# Patient Record
Sex: Female | Born: 1950 | Race: White | Hispanic: No | Marital: Married | Smoking: Current every day smoker
Health system: Southern US, Academic
[De-identification: ages and names within clinical notes are randomized; demographics above are authoritative.]

## PROBLEM LIST (undated history)

## (undated) DIAGNOSIS — J449 Chronic obstructive pulmonary disease, unspecified: Secondary | ICD-10-CM

## (undated) DIAGNOSIS — M1990 Unspecified osteoarthritis, unspecified site: Secondary | ICD-10-CM

## (undated) DIAGNOSIS — C801 Malignant (primary) neoplasm, unspecified: Secondary | ICD-10-CM

## (undated) DIAGNOSIS — C3490 Malignant neoplasm of unspecified part of unspecified bronchus or lung: Secondary | ICD-10-CM

## (undated) HISTORY — PX: HX COLONOSCOPY: 2100001147

## (undated) HISTORY — DX: Unspecified osteoarthritis, unspecified site: M19.90

## (undated) HISTORY — PX: HX APPENDECTOMY: SHX54

## (undated) HISTORY — DX: Malignant neoplasm of unspecified part of unspecified bronchus or lung (CMS HCC): C34.90

## (undated) HISTORY — PX: BRONCHOSCOPY: SUR163

## (undated) HISTORY — PX: HX TUBAL LIGATION: SHX77

## (undated) HISTORY — PX: NECK SURGERY: SHX720

## (undated) HISTORY — PX: HX GALL BLADDER SURGERY/CHOLE: SHX55

## (undated) HISTORY — DX: Malignant (primary) neoplasm, unspecified (CMS HCC): C80.1

## (undated) HISTORY — PX: PORTACATH PLACEMENT: SHX2246

---

## 2001-09-09 ENCOUNTER — Other Ambulatory Visit (HOSPITAL_COMMUNITY): Payer: Self-pay | Admitting: Family Medicine

## 2020-12-25 IMAGING — MR MRI SHOULDER RT W/O CONTRAST
8 of 9 series · 33 of 40 positions shown · IV contrast (gadolinium)
Comparison: Radiographs from outside facility dated 12/12/2020.

﻿EXAM:  [id number]   MRI SHOULDER RT W/O CONTRAST
INDICATION: Pain.
TECHNIQUE: Multiplanar multisequential MRI of the right shoulder joint was performed without gadolinium contrast.

[Series 4: axial shim · axial · right · 10.0mm · 3.12mm/px · z∈[-80,+40]mm · 2 of 13 slices shown (1 of 2)]
[im 1/13]
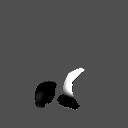
[im 13/13]
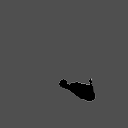

[Series 5: axial shim · axial · right · 10.0mm · 3.12mm/px · z∈[-80,+40]mm · 3 of 13 slices shown (2 of 2)]
[im 1/13]
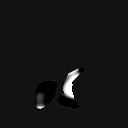
[im 7/13]
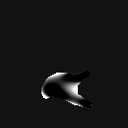
[im 13/13]
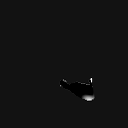

[Series 6: T1 · oblique · right · 4.0mm · 0.31mm/px · 5 of 22 slices shown]
[im 1/22]
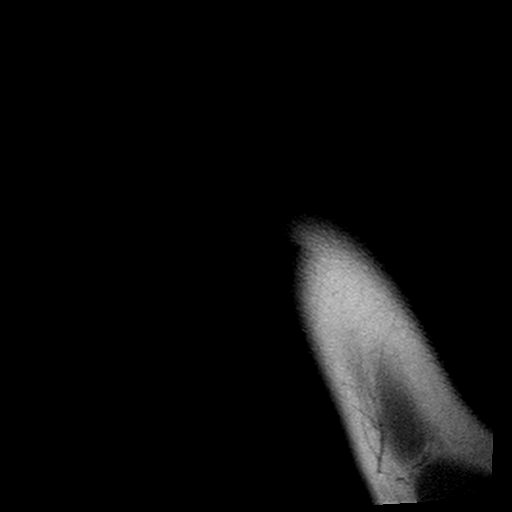
[im 6/22]
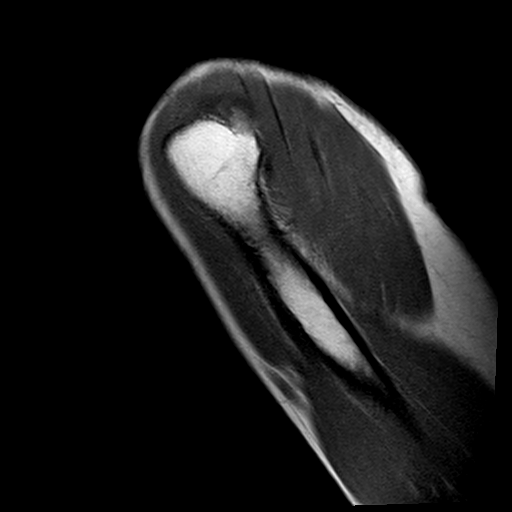
[im 11/22]
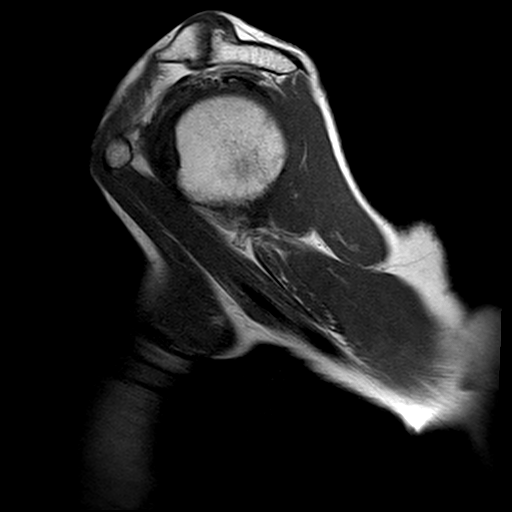
[im 16/22]
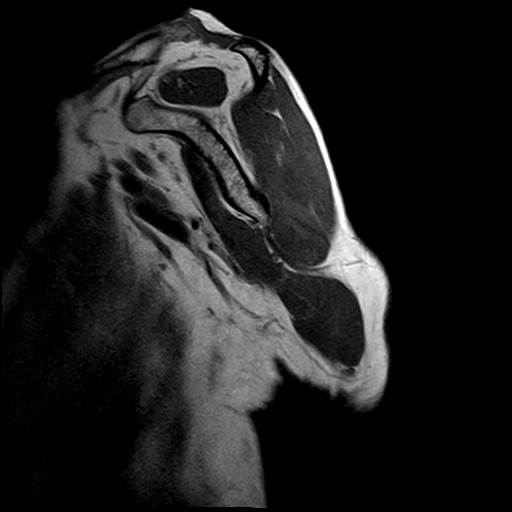
[im 22/22]
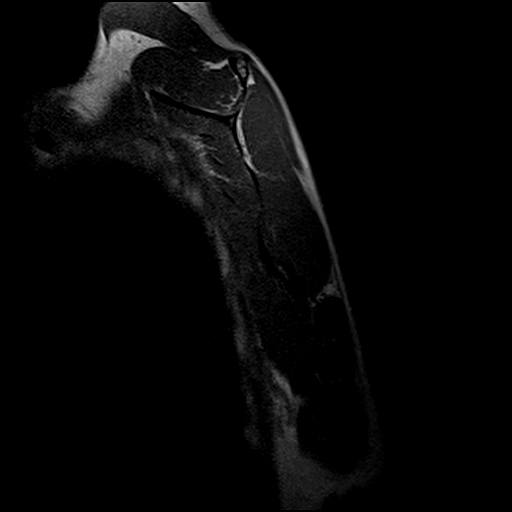

[Series 7: T2 · oblique · right · 4.0mm · 0.42mm/px · 5 of 22 slices shown]
[im 1/22]
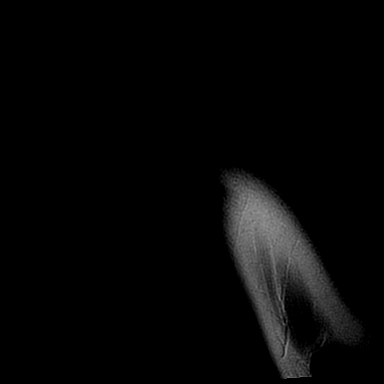
[im 6/22]
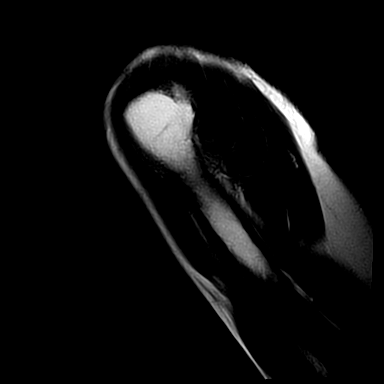
[im 11/22]
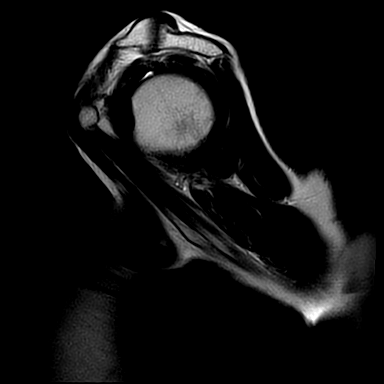
[im 16/22]
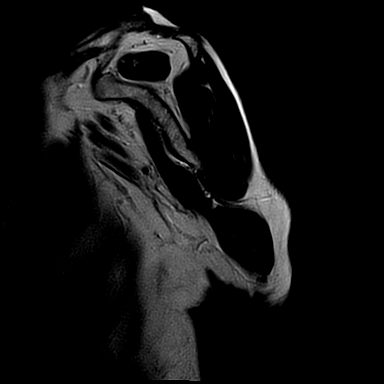
[im 22/22]
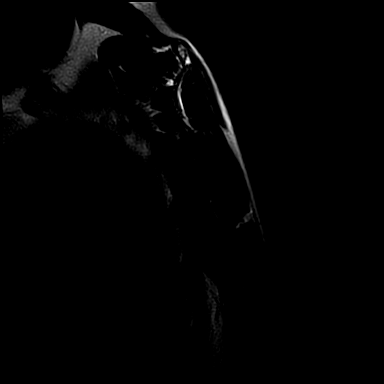

[Series 8: STIR · oblique · right · 4.0mm · 0.36mm/px · 3 of 22 slices shown]
[im 1/22]
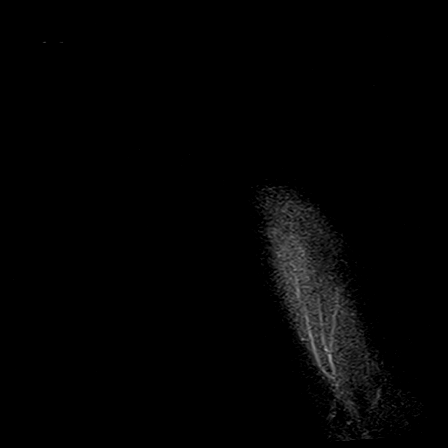
[im 6/22]
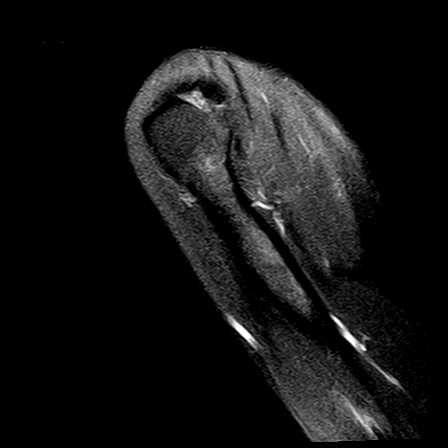
[im 11/22]
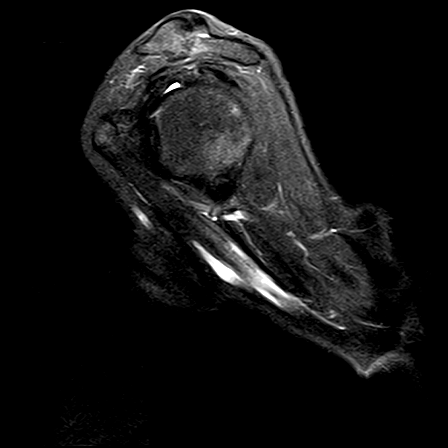

[Series 9: PD fat-sat · axial · right · 4.0mm · 0.36mm/px · z∈[-64,+46]mm · 5 of 24 slices shown (1 of 2)]
[im 1/24]
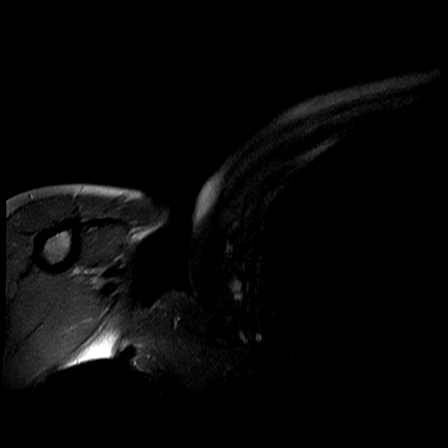
[im 6/24]
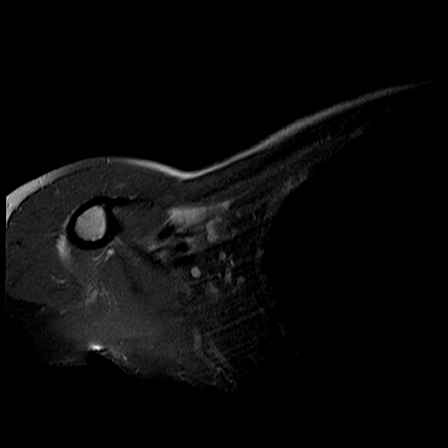
[im 12/24]
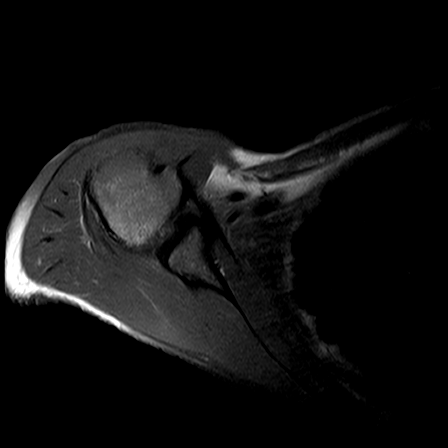
[im 18/24]
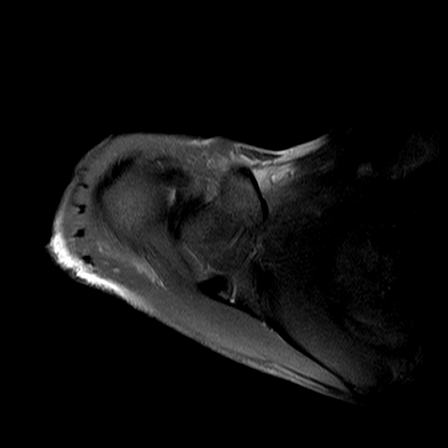
[im 24/24]
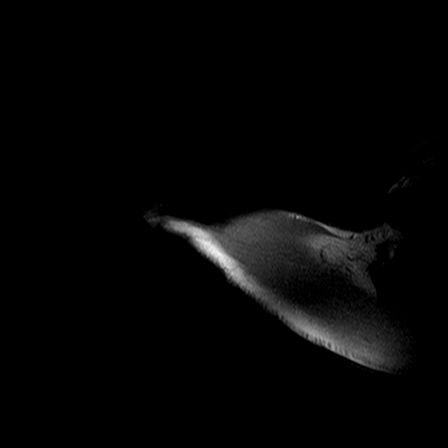

[Series 10: PD fat-sat · oblique · right · 4.0mm · 0.47mm/px · 5 of 23 slices shown (2 of 2)]
[im 1/23]
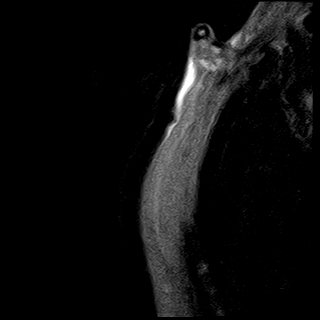
[im 6/23]
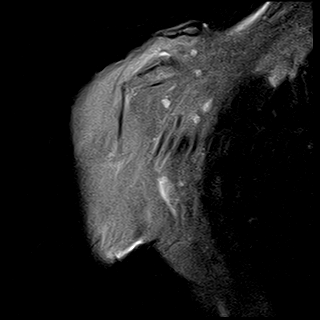
[im 12/23]
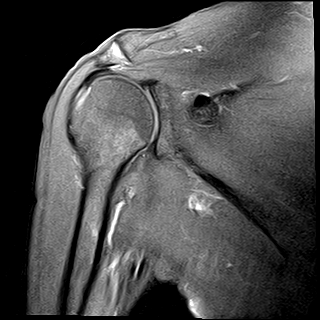
[im 17/23]
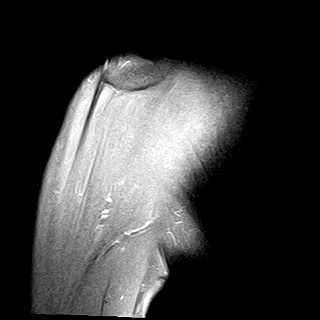
[im 23/23]
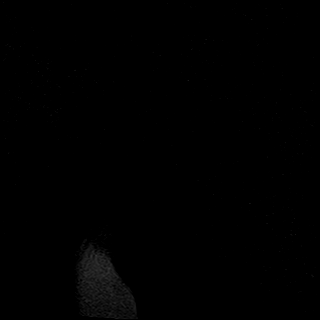

[Series 12: T2 fat-sat · axial · right · 4.0mm · 0.42mm/px · z∈[-64,+46]mm · 5 of 24 slices shown]
[im 1/24]
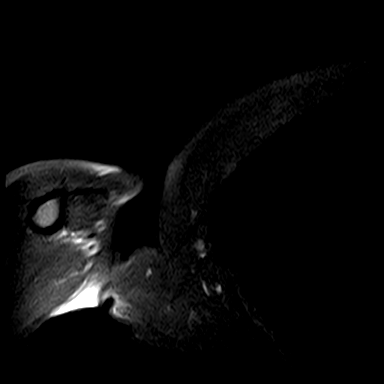
[im 6/24]
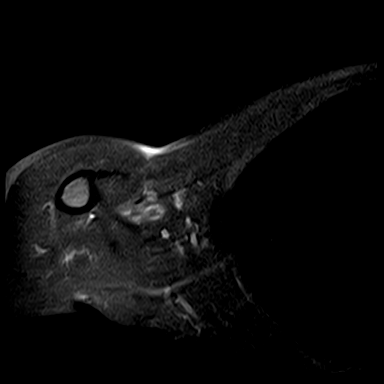
[im 12/24]
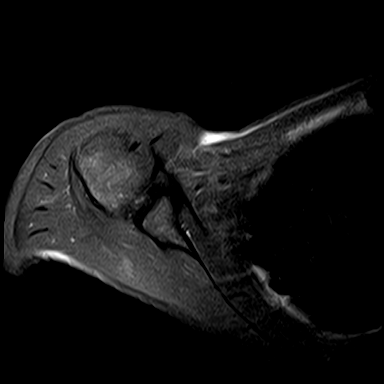
[im 18/24]
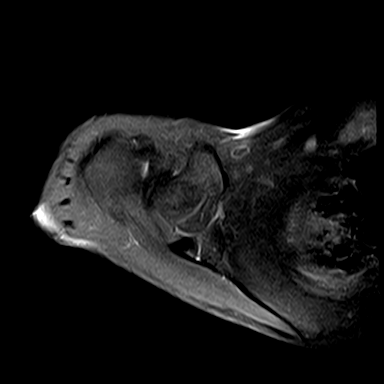
[im 24/24]
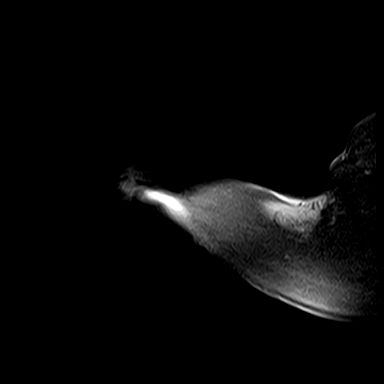

[33 of 40 positions shown; findings below may reference images not displayed]

FINDINGS: There is mild supraspinatus tendinopathy. Infraspinatus, teres minor and subscapularis muscles and tendons are within normal limits in morphology and signal intensity. Long head of biceps tendon is well seated within the intertubercular groove and attaches normally to the biceps anchor. Superior labrum is intact. Glenohumeral articular cartilage is well maintained. There is mild acromioclavicular joint osteoarthritis. There is no significant fluid within the subacromial/subdeltoid bursa. Muscle bulk and bone marrow signal intensity are normal. No mass is seen along the course of the suprascapular nerve, within the spinoglenoid notch or within the quadrilateral space.
IMPRESSION: 1. Mild supraspinatus tendinopathy. 

2. Mild acromioclavicular joint osteoarthritis.

## 2020-12-25 IMAGING — MR MRI CERVICAL SPINE WITHOUT CONTRAST
6 series · 28 of 48 positions shown · IV contrast (gadolinium)
Comparison: Radiographs from outside facility dated 12/12/2020.

﻿EXAM:  [id number]   MRI CERVICAL SPINE WITHOUT CONTRAST
INDICATION: Neck and right shoulder pain.
TECHNIQUE: Multiplanar multisequential MRI of the cervical spine was performed without gadolinium contrast.

[Series 4: s-map · sagittal · 8.8mm · 4.38mm/px · 8 of 100 slices shown]
[im 4/100]
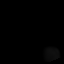
[im 16/100]
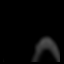
[im 32/100]
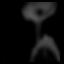
[im 44/100]
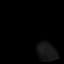
[im 56/100]
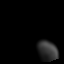
[im 68/100]
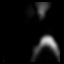
[im 84/100]
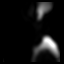
[im 96/100]
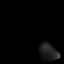

[Series 5: T2 · sagittal · 3.0mm · 0.75mm/px · 4 of 15 slices shown (1 of 2)]
[im 1/15]
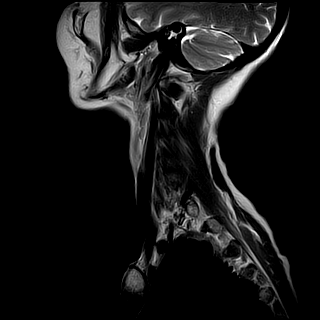
[im 5/15]
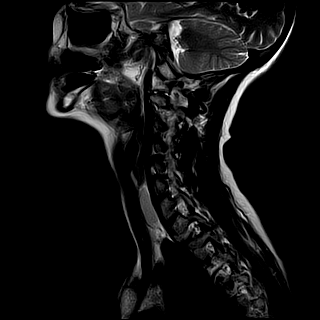
[im 10/15]
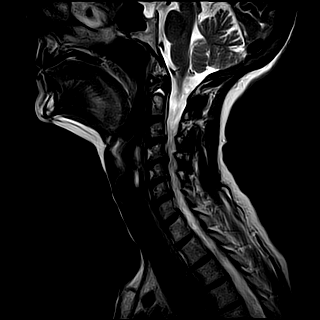
[im 15/15]
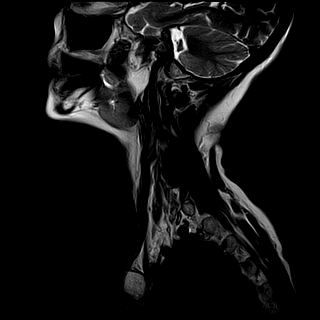

[Series 6: T1 · sagittal · 3.0mm · 0.47mm/px · 4 of 15 slices shown]
[im 1/15]
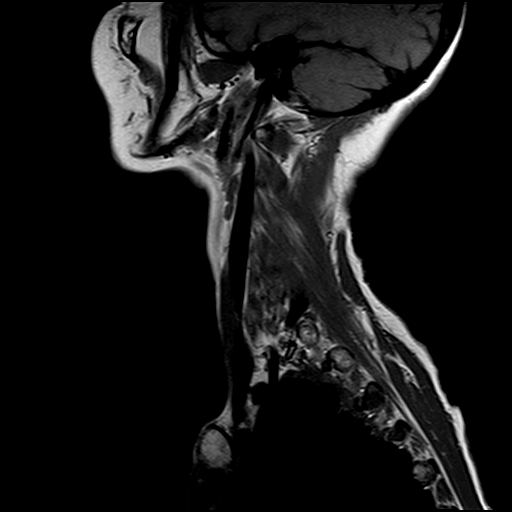
[im 5/15]
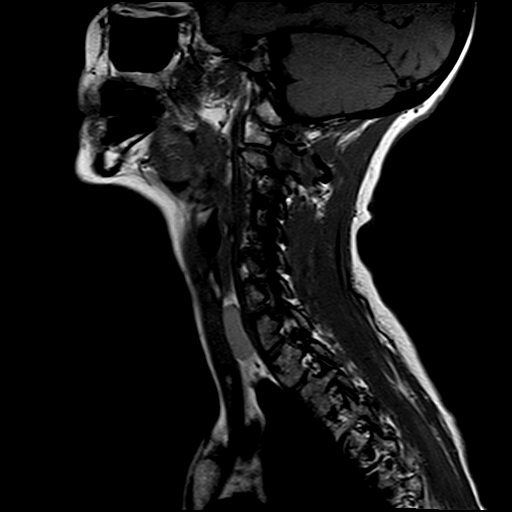
[im 10/15]
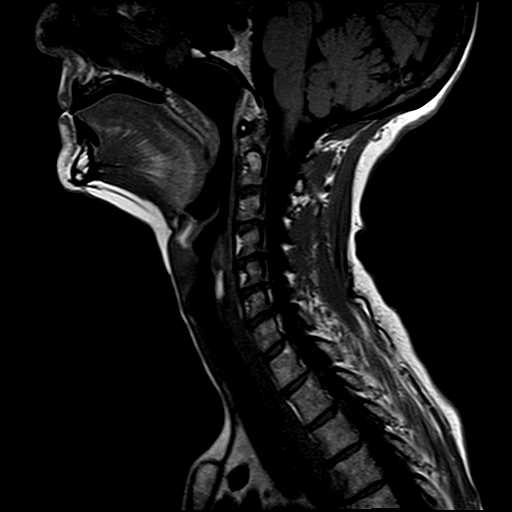
[im 15/15]
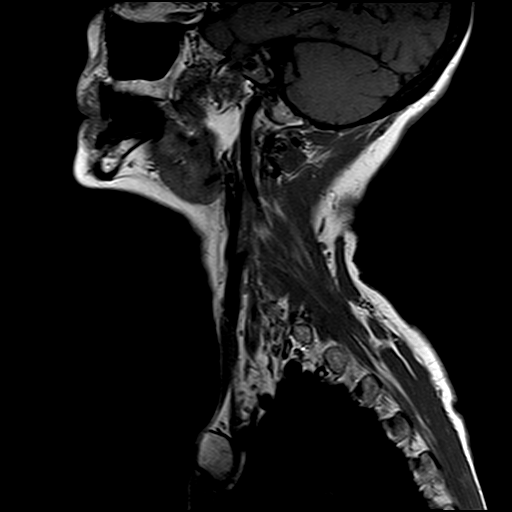

[Series 7: STIR · sagittal · 3.0mm · 0.47mm/px · 4 of 15 slices shown]
[im 1/15]
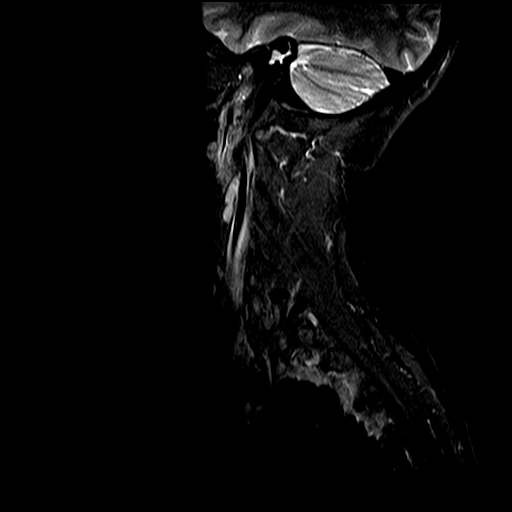
[im 5/15]
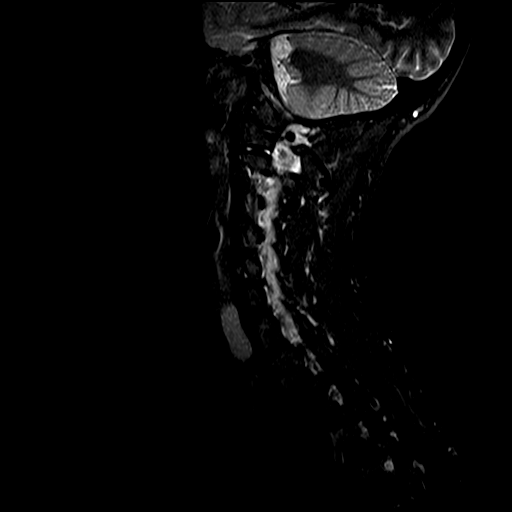
[im 10/15]
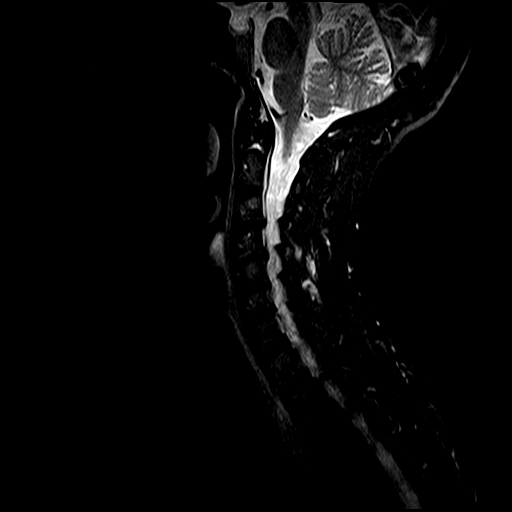
[im 15/15]
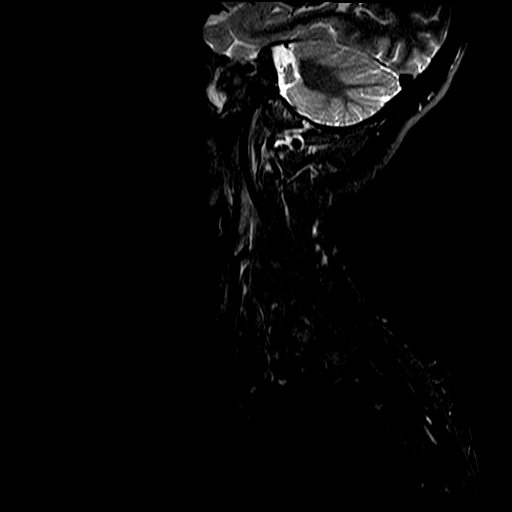

[Series 8: T2-star · axial · 3.0mm · 0.39mm/px · z∈[-101,-39]mm · 3 of 18 slices shown]
[im 1/18]
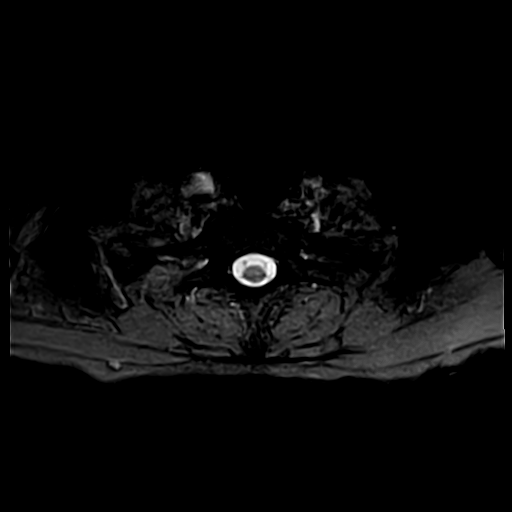
[im 5/18]
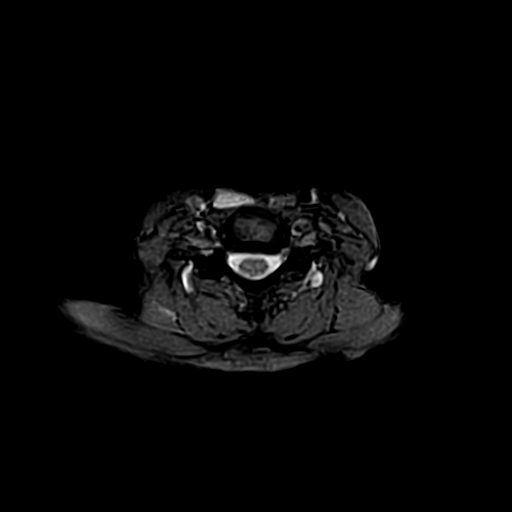
[im 9/18]
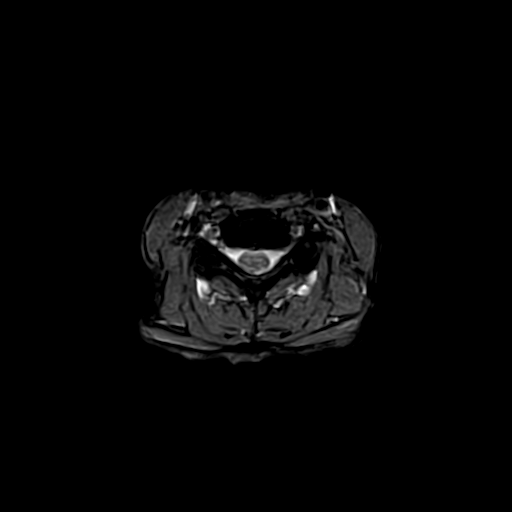

[Series 9: T2 · axial · 3.0mm · 0.39mm/px · z∈[-101,+13]mm · 5 of 18 slices shown (2 of 2)]
[im 1/18]
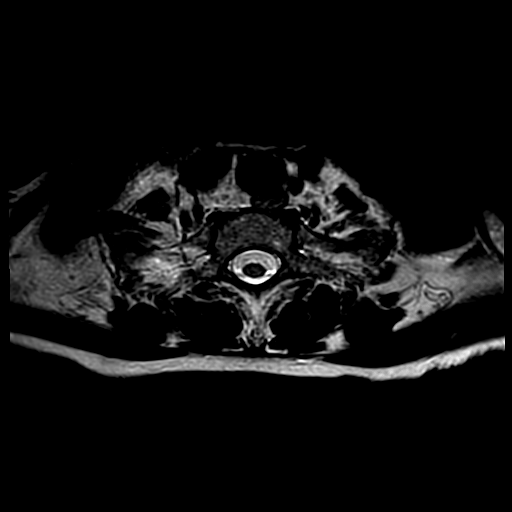
[im 5/18]
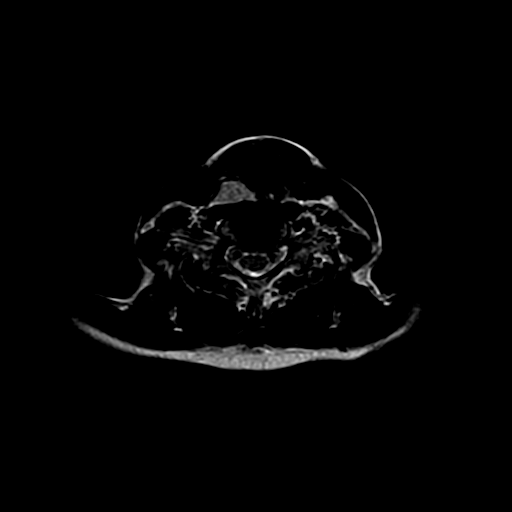
[im 9/18]
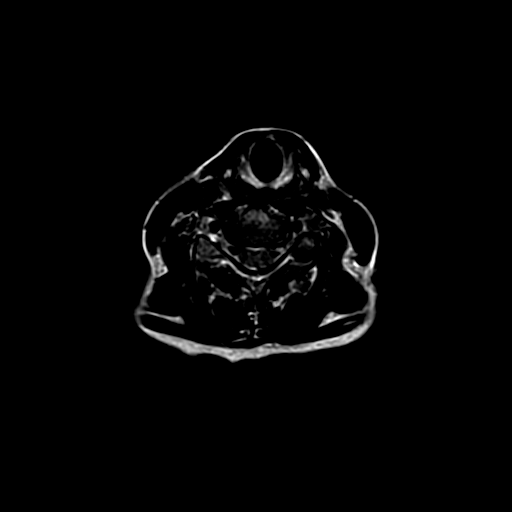
[im 13/18]
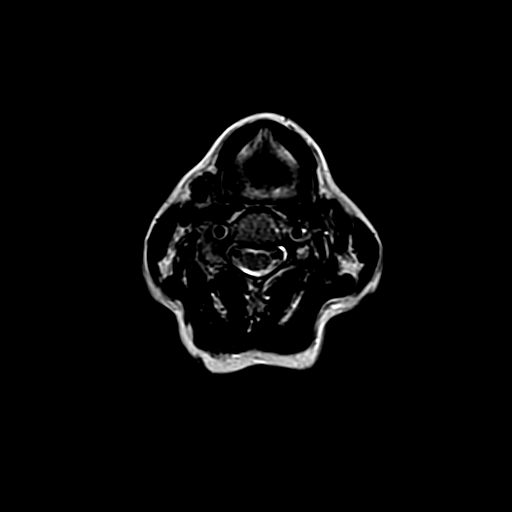
[im 18/18]
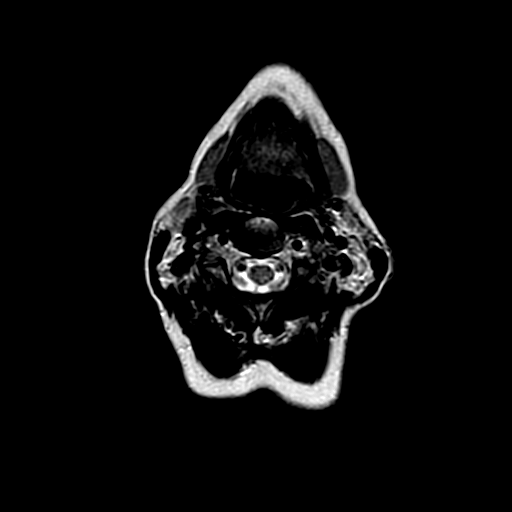

[28 of 48 positions shown; findings below may reference images not displayed]

FINDINGS: Bone marrow signal intensity is normal. There is no acute fracture or subluxation. Visualized spinal cord is normal in signal intensity without evidence of compression at any level.

At C2-3 level, there is a minimal bulging annulus, minimally effacing the ventral CSF. There is no significant neural foraminal stenosis.

At C3-4 level, there is a small broad-based central disc osteophyte complex with near complete effacement of the ventral CSF. There is no significant neural foraminal stenosis.

At C4-5 level, there is minimal retrolisthesis of C4 on C5 vertebral body. There is a small broad-based central disc osteophyte complex with near complete effacement of the ventral CSF. There is mild-to-moderate left neural foraminal stenosis from facet and uncovertebral joint hypertrophy.

At C5-6 level, there is mild-to-moderate left neural foraminal stenosis from uncovertebral joint hypertrophy.

At C6-7 level, there is a small broad-based central disc bulge, mildly effacing the ventral CSF. There is no significant neural foraminal stenosis.

C7-T1 level and paraspinal soft tissues are unremarkable.
IMPRESSION: 1. Minimal retrolisthesis of C4 on C5 vertebral body. 

2. Near complete effacement of the ventral CSF at C3-4 and C4-5 levels from small central disc osteophyte complexes. 

3. Multilevel neural foraminal stenosis as detailed above.

## 2021-02-25 IMAGING — CT CT THORAX W/O & W/ CONTRAST
3 of 5 series · 16 of 36 positions shown, 18 images · IV contrast (omnipaque)
Comparison: None available.

﻿EXAM:  CT THORAX W/O & W/ CONTRAST
INDICATION: Chest pain for several months. More than 50 year smoking history.
TECHNIQUE: Helical CT imaging of the chest was performed without and with 50 mL of Omnipaque 350. Images were reviewed in multiple windows and projections. Exam was performed using 1 or more of the following dose reduction techniques: Automated exposure control, adjustment of the mA and/or kV according to patient size, or the use of iterative reconstruction technique.

[ax post · axial · 0.64mm/px · z∈[-881,-677]mm · 5 of 77 slices shown, 7 images]
[im 13/77  mediastinal]
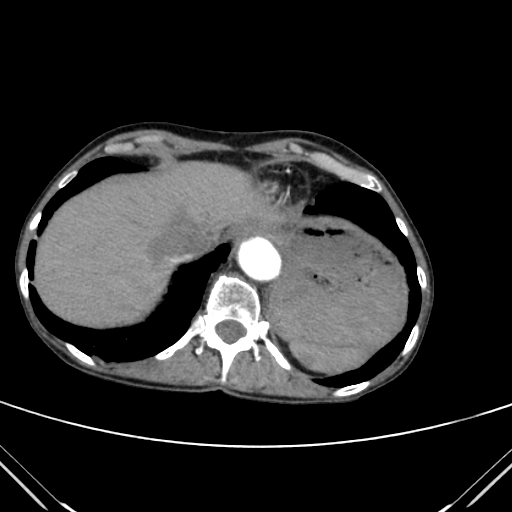
[im 13/77  lung]
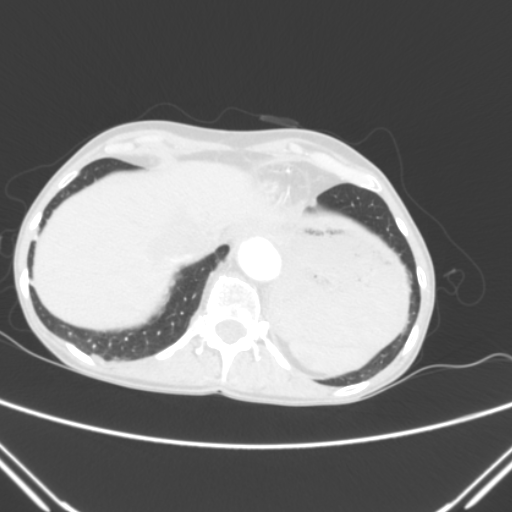
[im 26/77  lung]
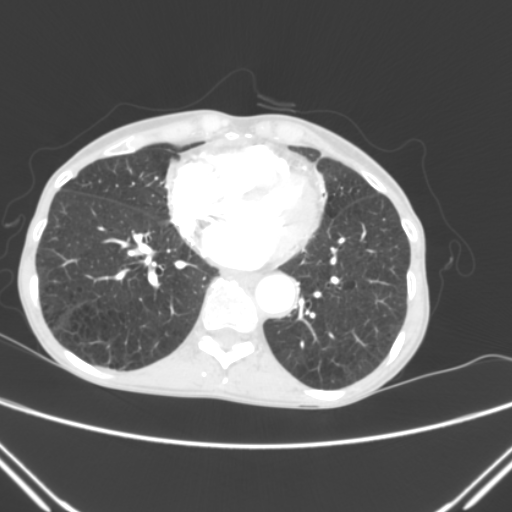
[im 39/77  lung]
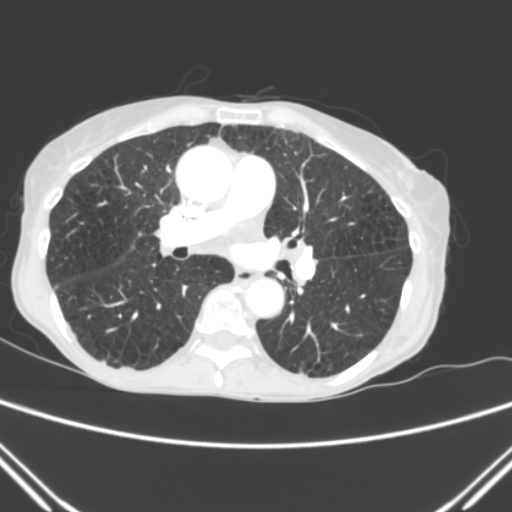
[im 51/77  lung]
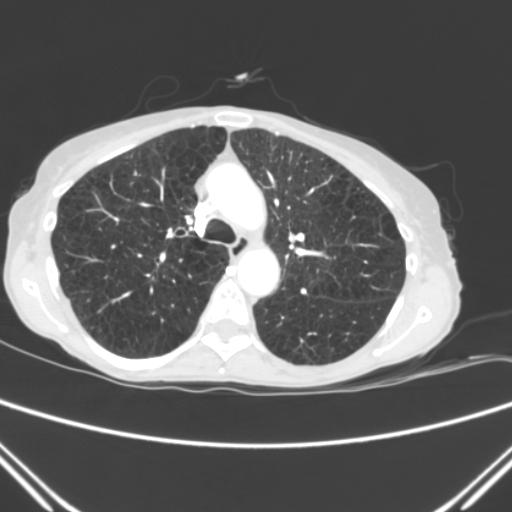
[im 64/77  mediastinal]
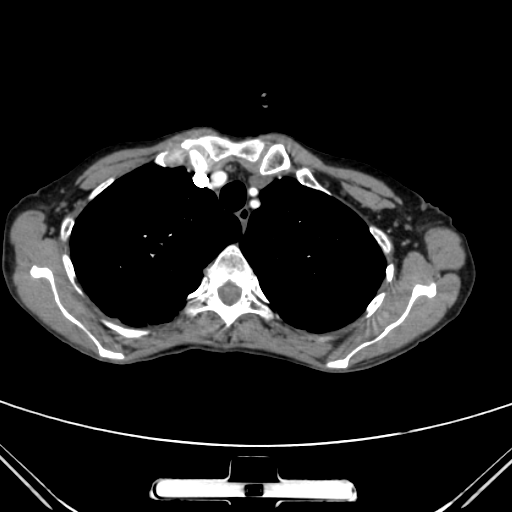
[im 64/77  lung]
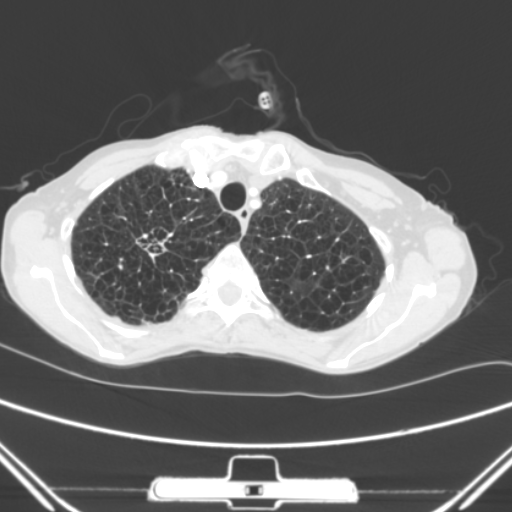

[cor · coronal · 0.72mm/px · 3 of 49 slices shown]
[im 10/49  lung]
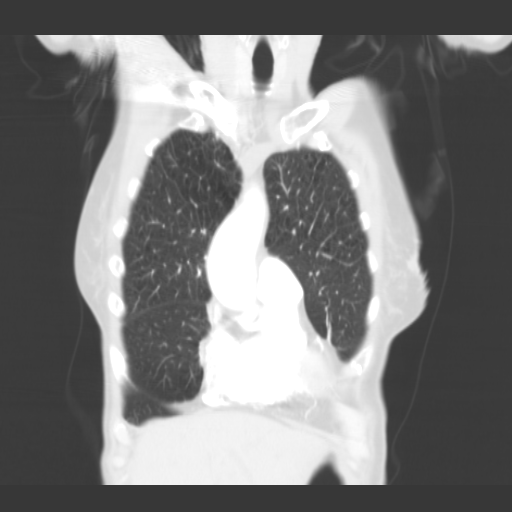
[im 20/49  lung]
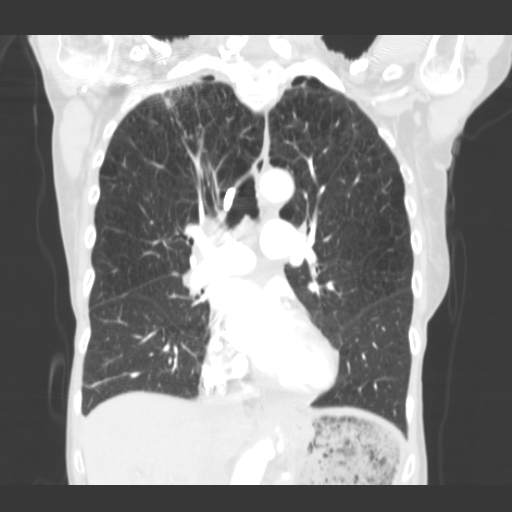
[im 29/49  lung]
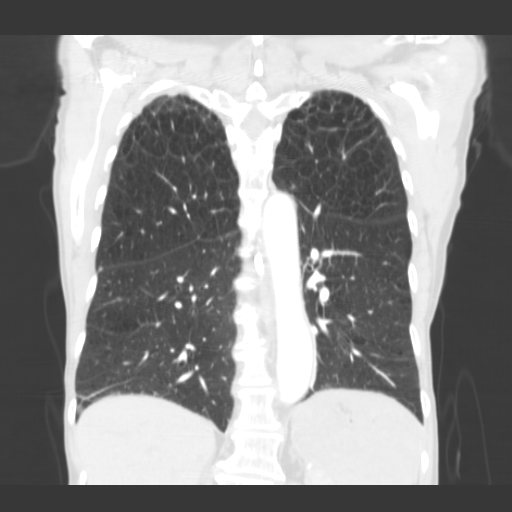

[lung · axial · 0.56mm/px · z∈[-892,-644]mm · 8 of 148 slices shown]
[im 12/148  lung]
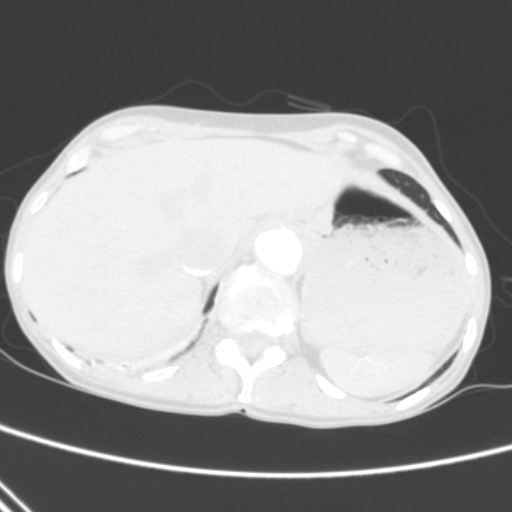
[im 34/148  lung]
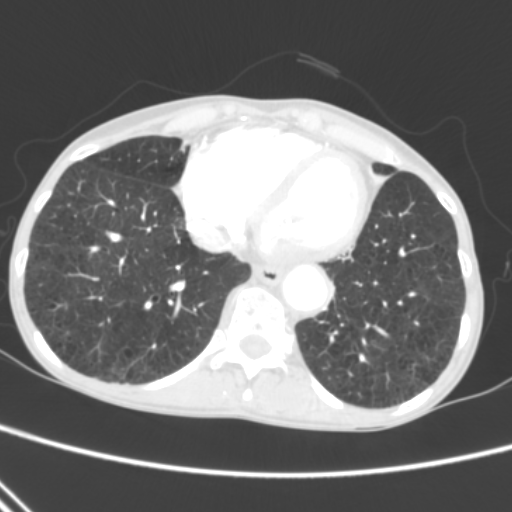
[im 46/148  lung]
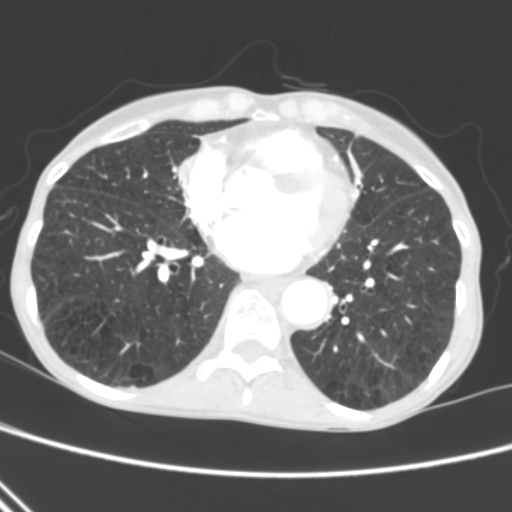
[im 68/148  lung]
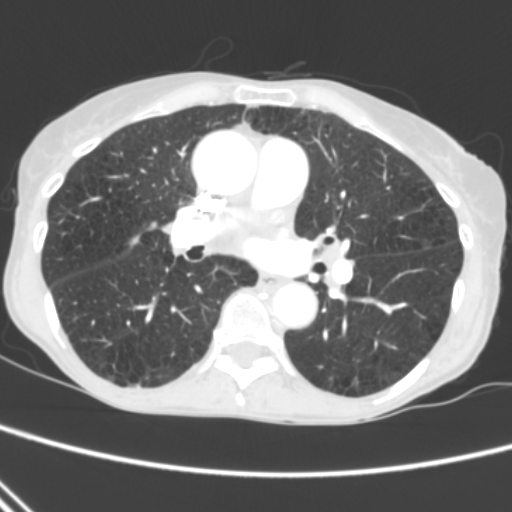
[im 80/148  lung]
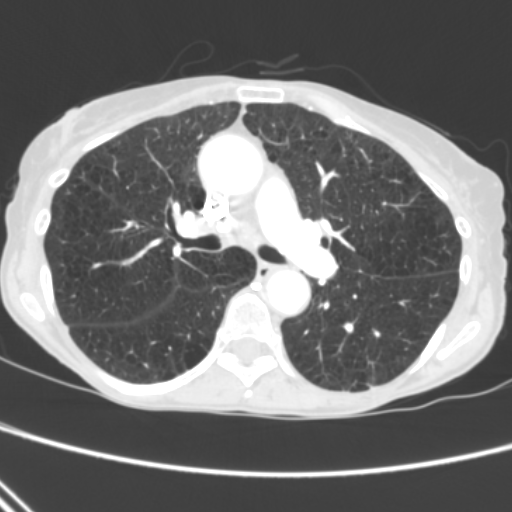
[im 102/148  lung]
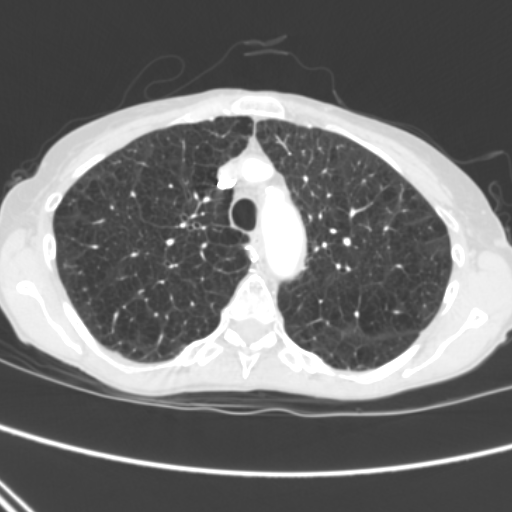
[im 114/148  lung]
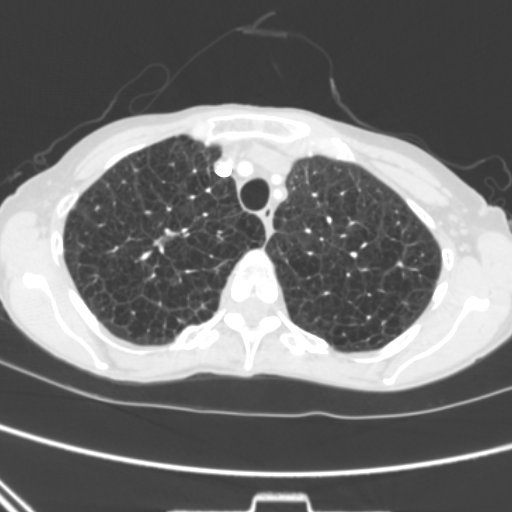
[im 136/148  lung]
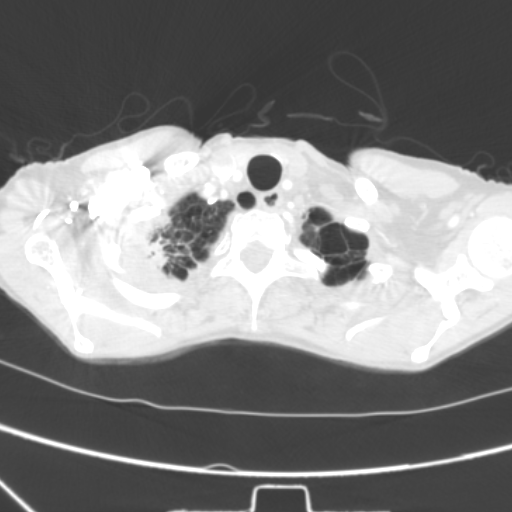

[16 of 36 positions shown; findings below may reference images not displayed]

FINDINGS: Thyroid gland appears unremarkable. Trachea and mainstem bronchi are patent. There is no hilar, mediastinal or axillary adenopathy. There are moderate to severe emphysematous changes within the lungs with right apical scarring. No suspicious lung nodule is seen. There is no pleural or pericardial effusion. There is no lung consolidation. There is no aortic aneurysm or dissection. Subcentimeter low attenuation area within the anterior right hepatic lobe is too small to be adequately characterized. Visualized osseous structures appear unremarkable.
IMPRESSION: 1. Moderate to severe emphysematous changes within the lungs and right apical scarring. Patient should undergo a low-dose lung cancer screening chest CT scan annually given the extensive smoking history. 

2. No lung consolidation, pleural or pericardial.

## 2021-11-13 ENCOUNTER — Other Ambulatory Visit (HOSPITAL_COMMUNITY): Payer: Self-pay | Admitting: Pulmonary Disease

## 2021-11-13 DIAGNOSIS — R911 Solitary pulmonary nodule: Secondary | ICD-10-CM

## 2021-11-28 ENCOUNTER — Inpatient Hospital Stay
Admission: RE | Admit: 2021-11-28 | Discharge: 2021-11-28 | Disposition: A | Discharge status: Home / Self Care | Payer: Commercial Managed Care - PPO | Source: Ambulatory Visit | Attending: Pulmonary Disease | Admitting: Pulmonary Disease

## 2021-11-28 ENCOUNTER — Other Ambulatory Visit: Payer: Self-pay

## 2021-11-28 ENCOUNTER — Other Ambulatory Visit (HOSPITAL_COMMUNITY): Payer: Commercial Managed Care - PPO

## 2021-11-28 DIAGNOSIS — Z20822 Contact with and (suspected) exposure to covid-19: Secondary | ICD-10-CM | POA: Insufficient documentation

## 2021-11-28 DIAGNOSIS — Z01812 Encounter for preprocedural laboratory examination: Secondary | ICD-10-CM | POA: Insufficient documentation

## 2021-11-28 DIAGNOSIS — R911 Solitary pulmonary nodule: Secondary | ICD-10-CM | POA: Insufficient documentation

## 2021-11-28 DIAGNOSIS — E279 Disorder of adrenal gland, unspecified: Secondary | ICD-10-CM | POA: Insufficient documentation

## 2021-11-28 DIAGNOSIS — D4411 Neoplasm of uncertain behavior of right adrenal gland: Secondary | ICD-10-CM

## 2021-11-28 DIAGNOSIS — Z01818 Encounter for other preprocedural examination: Secondary | ICD-10-CM

## 2021-11-28 DIAGNOSIS — D487 Neoplasm of uncertain behavior of other specified sites: Secondary | ICD-10-CM | POA: Insufficient documentation

## 2021-11-28 LAB — CBC WITH DIFF
BASOPHIL #: 0.1 10*3/uL (ref 0.00–0.30)
BASOPHIL %: 1 % (ref 0–3)
EOSINOPHIL #: 0.1 10*3/uL (ref 0.00–0.80)
EOSINOPHIL %: 1 % (ref 0–7)
HCT: 42.5 % (ref 37.0–47.0)
HGB: 14.4 g/dL (ref 12.5–16.0)
LYMPHOCYTE #: 1.6 10*3/uL (ref 1.10–5.00)
LYMPHOCYTE %: 23 % — ABNORMAL LOW (ref 25–45)
MCH: 32.7 pg — ABNORMAL HIGH (ref 27.0–32.0)
MCHC: 33.8 g/dL (ref 32.0–36.0)
MCV: 96.7 fL (ref 78.0–99.0)
MONOCYTE #: 0.4 10*3/uL (ref 0.00–1.30)
MONOCYTE %: 6 % (ref 0–12)
MPV: 8.5 fL (ref 7.4–10.4)
NEUTROPHIL #: 5 10*3/uL (ref 1.80–8.40)
NEUTROPHIL %: 69 % (ref 40–76)
PLATELETS: 260 10*3/uL (ref 140–440)
RBC: 4.4 10*6/uL (ref 4.20–5.40)
RDW: 13.3 % (ref 11.6–14.8)
WBC: 7.2 10*3/uL (ref 4.0–10.5)
WBCS UNCORRECTED: 7.2 10*3/uL

## 2021-11-28 LAB — PTT (PARTIAL THROMBOPLASTIN TIME): APTT: 38 seconds — ABNORMAL HIGH (ref 26.0–36.0)

## 2021-11-28 LAB — BASIC METABOLIC PANEL
ANION GAP: 8 mmol/L — ABNORMAL LOW (ref 10–20)
BUN/CREA RATIO: 15 (ref 6–22)
BUN: 10 mg/dL (ref 7–25)
CALCIUM: 9.9 mg/dL (ref 8.6–10.3)
CHLORIDE: 107 mmol/L (ref 98–107)
CO2 TOTAL: 25 mmol/L (ref 21–31)
CREATININE: 0.68 mg/dL (ref 0.60–1.30)
ESTIMATED GFR: 94 mL/min/{1.73_m2} (ref >59)
GLUCOSE: 90 mg/dL (ref 74–109)
OSMOLALITY, CALCULATED: 278 mOsm/kg (ref 270–290)
POTASSIUM: 4.1 mmol/L (ref 3.5–5.1)
SODIUM: 140 mmol/L (ref 136–145)

## 2021-11-28 LAB — PT/INR
INR: 1.1 (ref <=5.00)
PROTHROMBIN TIME: 12.7 seconds (ref 9.8–12.7)

## 2021-11-28 LAB — COVID-19 CEPHEID - LAB USE ONLY: SARS-CoV-2: NEGATIVE

## 2021-12-02 ENCOUNTER — Other Ambulatory Visit: Payer: Self-pay

## 2021-12-02 ENCOUNTER — Inpatient Hospital Stay
Admission: RE | Admit: 2021-12-02 | Discharge: 2021-12-02 | Disposition: A | Discharge status: Home / Self Care | Payer: Commercial Managed Care - PPO | Source: Ambulatory Visit | Attending: Pulmonary Disease | Admitting: Pulmonary Disease

## 2021-12-02 ENCOUNTER — Encounter (HOSPITAL_COMMUNITY): Payer: Commercial Managed Care - PPO | Admitting: Pulmonary Disease

## 2021-12-02 ENCOUNTER — Encounter (HOSPITAL_COMMUNITY)
Admission: RE | Disposition: A | Discharge status: Home / Self Care | Payer: Self-pay | Source: Ambulatory Visit | Attending: Pulmonary Disease

## 2021-12-02 ENCOUNTER — Encounter (HOSPITAL_COMMUNITY): Payer: Self-pay | Admitting: Pulmonary Disease

## 2021-12-02 ENCOUNTER — Ambulatory Visit (HOSPITAL_BASED_OUTPATIENT_CLINIC_OR_DEPARTMENT_OTHER): Payer: Commercial Managed Care - PPO

## 2021-12-02 DIAGNOSIS — R918 Other nonspecific abnormal finding of lung field: Secondary | ICD-10-CM

## 2021-12-02 DIAGNOSIS — J9811 Atelectasis: Secondary | ICD-10-CM | POA: Insufficient documentation

## 2021-12-02 DIAGNOSIS — R590 Localized enlarged lymph nodes: Secondary | ICD-10-CM | POA: Insufficient documentation

## 2021-12-02 DIAGNOSIS — T17990A Other foreign object in respiratory tract, part unspecified in causing asphyxiation, initial encounter: Secondary | ICD-10-CM | POA: Insufficient documentation

## 2021-12-02 HISTORY — DX: Chronic obstructive pulmonary disease, unspecified (CMS HCC): J44.9

## 2021-12-02 SURGERY — BRONCHOSCOPY WITH BIOPSY
Anesthesia: IV Sedation (Nurse Monitored) | Site: Chest | Wound class: Clean Contaminated Wounds-The respiratory, GI, Genital, or urinary

## 2021-12-02 MED ORDER — FENTANYL (PF) 50 MCG/ML INJECTION SOLUTION
INTRAMUSCULAR | Status: AC
Start: 2021-12-02 — End: 2021-12-02
  Filled 2021-12-02: qty 2

## 2021-12-02 MED ORDER — ALBUTEROL SULFATE 2.5 MG/3 ML (0.083 %) SOLUTION FOR NEBULIZATION
2.5000 mg | INHALATION_SOLUTION | Freq: Once | RESPIRATORY_TRACT | Status: AC
Start: 2021-12-02 — End: 2021-12-02
  Administered 2021-12-02: 2.5 mg via RESPIRATORY_TRACT

## 2021-12-02 MED ORDER — BENZOCAINE 20% METERED DOSE ORAL SPRAY
1.0000 | Freq: Once | ORAL | Status: DC
Start: 2021-12-02 — End: 2021-12-02

## 2021-12-02 MED ORDER — MIDAZOLAM 5 MG/ML INJECTION WRAPPER
2.0000 mg | INTRAMUSCULAR | Status: DC | PRN
Start: 2021-12-02 — End: 2021-12-02
  Administered 2021-12-02: 1 mg via INTRAVENOUS

## 2021-12-02 MED ORDER — DEXTROSE 5 % IN WATER (D5W) INTRAVENOUS SOLUTION
INTRAVENOUS | Status: DC
Start: 2021-12-02 — End: 2021-12-02
  Administered 2021-12-02: 500 mL via INTRAVENOUS

## 2021-12-02 MED ORDER — BENZOCAINE 20% METERED DOSE ORAL SPRAY
Freq: Once | ORAL | Status: DC | PRN
Start: 2021-12-02 — End: 2021-12-02
  Administered 2021-12-02: 1 via OROMUCOSAL

## 2021-12-02 MED ORDER — LIDOCAINE 2 % MUCOSAL JELLY IN APPLICATOR
Freq: Once | Status: DC | PRN
Start: 2021-12-02 — End: 2021-12-02
  Administered 2021-12-02: 6 mL via ENDOTRACHEOPULMONARY

## 2021-12-02 MED ORDER — MIDAZOLAM 5 MG/ML INJECTION WRAPPER
INTRAMUSCULAR | Status: AC
Start: 2021-12-02 — End: 2021-12-02
  Filled 2021-12-02: qty 1

## 2021-12-02 MED ORDER — LIDOCAINE HCL 10 MG/ML (1 %) INJECTION SOLUTION
INTRAMUSCULAR | Status: AC
Start: 2021-12-02 — End: 2021-12-02
  Filled 2021-12-02: qty 20

## 2021-12-02 MED ORDER — ALBUTEROL SULFATE 2.5 MG/3 ML (0.083 %) SOLUTION FOR NEBULIZATION
INHALATION_SOLUTION | RESPIRATORY_TRACT | Status: AC
Start: 2021-12-02 — End: 2021-12-02
  Filled 2021-12-02: qty 3

## 2021-12-02 MED ORDER — LIDOCAINE 2 % MUCOSAL JELLY IN APPLICATOR
Status: AC
Start: 2021-12-02 — End: 2021-12-02
  Filled 2021-12-02: qty 6

## 2021-12-02 MED ORDER — FENTANYL (PF) 50 MCG/ML INJECTION WRAPPER
INJECTION | Freq: Once | INTRAMUSCULAR | Status: DC | PRN
Start: 2021-12-02 — End: 2021-12-02
  Administered 2021-12-02: 10 ug via INTRAVENOUS

## 2021-12-02 MED ORDER — LIDOCAINE (PF) 40 MG/ML (4 %) INJECTION SOLUTION (RT USE CONFIG)
INTRAMUSCULAR | Status: AC
Start: 2021-12-02 — End: 2021-12-02
  Filled 2021-12-02: qty 5

## 2021-12-02 MED ORDER — LIDOCAINE HCL 10 MG/ML (1 %) INJECTION SOLUTION
Freq: Once | INTRAMUSCULAR | Status: DC | PRN
Start: 2021-12-02 — End: 2021-12-02
  Administered 2021-12-02: 20 mL

## 2021-12-02 MED ORDER — EPINEPHRINE HCL (PF) 1 MG/ML (1 ML) INJECTION SOLUTION
INTRAMUSCULAR | Status: AC
Start: 2021-12-02 — End: 2021-12-02
  Filled 2021-12-02: qty 1

## 2021-12-02 MED ORDER — BENZOCAINE 20% METERED DOSE ORAL SPRAY
ORAL | Status: AC
Start: 2021-12-02 — End: 2021-12-02
  Filled 2021-12-02: qty 60

## 2021-12-02 MED ORDER — LIDOCAINE (PF) 40 MG/ML (4 %) INJECTION SOLUTION (RT USE CONFIG)
2.0000 mL | Freq: Once | INTRAMUSCULAR | Status: AC
Start: 2021-12-02 — End: 2021-12-02
  Administered 2021-12-02: 80 mg via RESPIRATORY_TRACT

## 2021-12-02 SURGICAL SUPPLY — 6 items
BRUSH CYTO 1.5MM 140CM BLT TIP ERG HNDL ATO STP SHEATH CLBR STRL DISP PTFE BRONCHSCP (ENDOSCOPIC SUPPLIES) ×1 IMPLANT
BRUSH CYTOLOGY BRONCHOSCOPY_ERG HNDL ATO STP SHEATH CLBR (INSTRUMENTS ENDOMECHANICAL) ×1
VALVE BIOPSY US BRONCHSCP STRL LF  DISP (ENDOSCOPIC SUPPLIES) ×1 IMPLANT
VALVE BRONCHOSCOPE SUCT MAJ209_20 EA/BX (INSTRUMENTS ENDOMECHANICAL) ×1
VALVE BRONCHOSCOPE SUCT MAJ210_STRL DISP 20EA/BX (INSTRUMENTS ENDOMECHANICAL) ×1
VALVE SUCT FLXB ATTACH BRONCHSCP STRL LF  DISP (ENDOSCOPIC SUPPLIES) ×1 IMPLANT

## 2021-12-02 NOTE — Procedures (Signed)
[facility]         Bronchocscopy Proceure Note    Patient Name: Rose Solomon, Rose Solomon  Hospital Number: [id number]  Date of Service: 12/02/2021   Date of Birth: [birth year]      Pre-Operative Diagnosis: MUCOUS PLUGGING  RIGHT UPPER LOBE LUNG MASS , right-sided mediastinal lymphadenopathy, bibasilar atelectasis    Post-Operative Diagnosis: Right upper lobe lung mass    Procedure(s)/Description:  BRONCHOSCOPY with right upper lobe lavage and right upper lobe brushing: 31625 (CPT)     Procedure Note: Pocedure was explained to the patient and remembers and  consent was obtained was taken to endoscopy suite, was connected to continuous pulse ox cardiac monitor and blood pressure monitor, IV sedation was given, bronchoscope was inserted through the oral route, vocal cords were visualized were unremarkable without any tumor growth or paralysis, trachea was visualized next was unremarkable, carina were sharp and midline, bilateral tracheobronchial tree were examined did not reveal any endobronchial lesion, bronchoalveolar lavage was done from right upper lobe followed by endobronchial brushing . Bronchoscope was taken out patient tolerated procedure very well without any complication, patient was transferred to recovery room without any complication.     Findings: Right upper lobe lung mass , no endobronchial lesion    Recommendations:  If bronchoscopy turns out to be nonconclusive patient will require PET scan      Attending Surgeon: Dr. Unk Lightning    Specimens::   ID Type Source Tests Collected by Time Destination   1 : Right upper lobe lavage Wash Bronchoalveolar Lavage - Right Upper Lobe CYTOPATHOLOGY, NON GYN, FUNGUS CULTURE, CYTOMEGALOVIRUS (CMV), QUALITATIVE PCR, OTHER SAMPLES, AFB CULTURE WITH STAIN, HERPES SIMPLEX VIRUS, TYPE 1 AND 2 DNA, QUALITATIVE REAL TIME PCR, RESPIRATORY CULTURE AND GRAM STAIN, AEROBIC, LEGIONELLA DNA, QL REAL-TIME PCR Unk Lightning, MD 12/02/2021 0930    2 : right  upper lobe brushing Brushing Bronchial Brushing CYTOPATHOLOGY, NON GYN Unk Lightning, MD 12/02/2021 [phone]       Order Name Source Comment Collection Info Order Time   FUNGUS CULTURE Bronchoalveolar Lavage - Right Upper Lobe Pre-op diagnosis:  MUCOUS PLUGGING  RIGHT UPPER LOBE LUNG MASS Collected By: Unk Lightning, MD 12/02/2021  9:30 AM     Release to patient   Automated        AFB CULTURE WITH STAIN Bronchoalveolar Lavage - Right Upper Lobe Pre-op diagnosis:  MUCOUS PLUGGING  RIGHT UPPER LOBE LUNG MASS Collected By: Unk Lightning, MD 12/02/2021  9:30 AM     Release to patient   Automated        RESPIRATORY CULTURE AND GRAM STAIN, AEROBIC Bronchoalveolar Lavage - Right Upper Lobe Pre-op diagnosis:  MUCOUS PLUGGING  RIGHT UPPER LOBE LUNG MASS Collected By: Unk Lightning, MD 12/02/2021  9:30 AM     Release to patient   Automated        CYTOMEGALOVIRUS (CMV), QUALITATIVE PCR, OTHER SAMPLES Bronchoalveolar Lavage - Right Upper Lobe Pre-op diagnosis:  MUCOUS PLUGGING  RIGHT UPPER LOBE LUNG MASS Collected By: Unk Lightning, MD 12/02/2021  9:30 AM     Release to patient   Automated        HERPES SIMPLEX VIRUS, TYPE 1 AND 2 DNA, QUALITATIVE REAL TIME PCR Bronchoalveolar Lavage - Right Upper Lobe Pre-op diagnosis:  MUCOUS PLUGGING  RIGHT UPPER LOBE LUNG MASS Collected By: Unk Lightning, MD 12/02/2021  9:30 AM     Release to patient   Automated        LEGIONELLA DNA,  QL REAL-TIME PCR Bronchoalveolar Lavage - Right Upper Lobe Pre-op diagnosis:  MUCOUS PLUGGING  RIGHT UPPER LOBE LUNG MASS Collected By: Unk Lightning, MD 12/02/2021  9:30 AM     Release to patient   Automated        CYTOPATHOLOGY, NON GYN Bronchoalveolar Lavage - Right Upper Lobe Pre-op diagnosis:  MUCOUS PLUGGING  RIGHT UPPER LOBE LUNG MASS Collected By: Unk Lightning, MD 12/02/2021  9:30 AM     Release to patient   Automated                 Unk Lightning MD,FCCP,FASM  Board Certified ,[place] and Sleep Medicine

## 2021-12-02 NOTE — Discharge Instructions (Signed)
SURGICAL DISCHARGE INSTRUCTIONS     Dr. Unk Lightning, MD  performed your BRONCHOSCOPY with right upper lobe lavage and right upper lobe brushing today at the [facility]:  Monday through Friday from 8 a.m. - 4 p.m.: [phone]    For T&D: [phone]  Between 4 p.m. - 8 a.m., weekends and holidays:  Call ER [phone]    PLEASE SEE WRITTEN HANDOUTS AS DISCUSSED BY YOUR NURSE:  bronchoscopy    SIGNS AND SYMPTOMS OF A WOUND / INCISION INFECTION   Be sure to watch for the following:  Coughing up large amount of blood, Increased shortness of breath, fever, chills, decreased level of consciousness  Increase in fever for longer than 24 hours, or an increase that is higher than 101 degrees Fahrenheit (normal body temperature is 98 degrees Fahrenheit). The incision may feel warm to the touch.    **CALL YOUR DOCTOR IF ONE OR MORE OF THESE SIGNS / SYMPTOMS SHOULD OCCUR.    ANESTHESIA INFORMATION   ANESTHESIA -- ADULT PATIENTS:  You have received intravenous sedation / general anesthesia, and you may feel drowsy and light-headed for several hours. You may even experience some forgetfulness of the procedure. DO NOT DRIVE A MOTOR VEHICLE or perform any activity requiring complete alertness or coordination until you feel fully awake in about 24-48 hours. Do not drink alcoholic beverages for at least 24 hours. Do not stay alone, you must have a responsible adult available to be with you. You may also experience a dry mouth or nausea for 24 hours. This is a normal side effect and will disappear as the effects of the medication wear off.    REMEMBER   If you experience any difficulty breathing, chest pain, bleeding that you feel is excessive, persistent nausea or vomiting or for any other concerns:  Call your physician Dr.  Unk Lightning, MD   at [phone] . You may also ask to have the general doctor on call paged. They are available to you 24 hours a  day.      SPECIAL INSTRUCTIONS / COMMENTS   Rest today. No driving, working, Engineer, water or cooking today. Resume normal activity tomorrow.  Please use your inhalers or breathing treatments today.  Keep follow up appointment with Dr. Sherrye Payor.    FOLLOW-UP APPOINTMENTS   Please call your surgeon's office at the number listed to schedule a date / time of return for follow-up.     Dr Unk Lightning [phone]

## 2021-12-03 DIAGNOSIS — J948 Other specified pleural conditions: Secondary | ICD-10-CM

## 2021-12-03 LAB — CYTOPATHOLOGY, NON GYN

## 2021-12-04 LAB — HERPES SIMPLEX VIRUS, TYPE 1 AND 2 DNA, QUALITATIVE REAL TIME PCR
HSV 1 DNA: NOT DETECTED
HSV 2 DNA: NOT DETECTED

## 2021-12-04 LAB — RESPIRATORY CULTURE AND GRAM STAIN (PERFORMABLE)
GRAM STAIN: NONE SEEN
RESPIRATORY CULTURE: NORMAL

## 2021-12-05 LAB — LEGIONELLA DNA, QL REAL-TIME PCR
LEGIONELLA PNEUMOPHILA: NOT DETECTED
LEGIONELLA SPECIES: NOT DETECTED

## 2021-12-11 ENCOUNTER — Other Ambulatory Visit (HOSPITAL_COMMUNITY): Payer: Self-pay | Admitting: Pulmonary Disease

## 2021-12-11 DIAGNOSIS — R918 Other nonspecific abnormal finding of lung field: Secondary | ICD-10-CM

## 2021-12-30 LAB — FUNGUS CULTURE: FUNGAL CULTURE: NO GROWTH

## 2022-01-01 ENCOUNTER — Inpatient Hospital Stay
Admission: RE | Admit: 2022-01-01 | Discharge: 2022-01-01 | Disposition: A | Discharge status: Home / Self Care | Payer: Commercial Managed Care - PPO | Source: Ambulatory Visit | Attending: Pulmonary Disease | Admitting: Pulmonary Disease

## 2022-01-01 ENCOUNTER — Other Ambulatory Visit: Payer: Self-pay

## 2022-01-06 ENCOUNTER — Ambulatory Visit (HOSPITAL_COMMUNITY): Payer: Self-pay

## 2022-01-06 ENCOUNTER — Encounter (HOSPITAL_COMMUNITY): Payer: Self-pay

## 2022-01-06 ENCOUNTER — Inpatient Hospital Stay (HOSPITAL_BASED_OUTPATIENT_CLINIC_OR_DEPARTMENT_OTHER)
Admission: RE | Admit: 2022-01-06 | Discharge: 2022-01-06 | Disposition: A | Discharge status: Home / Self Care | Payer: Commercial Managed Care - PPO | Source: Ambulatory Visit

## 2022-01-06 ENCOUNTER — Other Ambulatory Visit: Payer: Self-pay

## 2022-01-06 ENCOUNTER — Inpatient Hospital Stay
Admission: RE | Admit: 2022-01-06 | Discharge: 2022-01-06 | Disposition: A | Discharge status: Home / Self Care | Payer: Commercial Managed Care - PPO | Source: Ambulatory Visit | Attending: Pulmonary Disease | Admitting: Pulmonary Disease

## 2022-01-06 DIAGNOSIS — C3411 Malignant neoplasm of upper lobe, right bronchus or lung: Secondary | ICD-10-CM | POA: Insufficient documentation

## 2022-01-06 DIAGNOSIS — R918 Other nonspecific abnormal finding of lung field: Secondary | ICD-10-CM

## 2022-01-06 MED ORDER — SODIUM CHLORIDE 0.9 % INTRAVENOUS SOLUTION
INTRAVENOUS | Status: DC
Start: 2022-01-06 — End: 2022-01-07

## 2022-01-06 MED ORDER — MIDAZOLAM 5 MG/ML INJECTION WRAPPER
Freq: Once | INTRAMUSCULAR | Status: AC | PRN
Start: 2022-01-06 — End: 2022-01-06
  Administered 2022-01-06: 1 mg via INTRAVENOUS
  Administered 2022-01-06: 2 mg via INTRAVENOUS

## 2022-01-06 MED ORDER — FENTANYL (PF) 50 MCG/ML INJECTION WRAPPER
INJECTION | Freq: Once | INTRAMUSCULAR | Status: AC | PRN
Start: 2022-01-06 — End: 2022-01-06
  Administered 2022-01-06: 25 ug via INTRAVENOUS
  Administered 2022-01-06: 15 ug via INTRAVENOUS

## 2022-01-06 MED ORDER — LIDOCAINE HCL 20 MG/ML (2 %) INJECTION SOLUTION
Freq: Once | INTRAMUSCULAR | Status: AC | PRN
Start: 2022-01-06 — End: 2022-01-06
  Administered 2022-01-06: 5 mL via INTRADERMAL

## 2022-01-06 MED ORDER — MIDAZOLAM 5 MG/ML INJECTION WRAPPER
INTRAMUSCULAR | Status: AC
Start: 2022-01-06 — End: 2022-01-06
  Filled 2022-01-06: qty 2

## 2022-01-06 MED ORDER — FENTANYL (PF) 50 MCG/ML INJECTION SOLUTION
INTRAMUSCULAR | Status: AC
Start: 2022-01-06 — End: 2022-01-06
  Filled 2022-01-06: qty 4

## 2022-01-06 MED ORDER — HYDROCODONE 7.5 MG-ACETAMINOPHEN 325 MG TABLET
1.0000 | ORAL_TABLET | Freq: Four times a day (QID) | ORAL | Status: DC | PRN
Start: 2022-01-06 — End: 2022-01-07

## 2022-01-06 NOTE — Discharge Instructions (Signed)
KEEP PREVIOUSLY SCHEDULED APPOINTMENT WITH REFERRING PHYSICIAN     CALL OFFICE FOR ISSUES OR CONCERNS    MONITOR SURGICAL SITE FOR REDNESS, DRAINAGE, BLEEDING AND MONITOR TEMPERATURE FOR GREATER THAN 100.4. NOTIFY PHYSICIAN IF SYMPTOMS OCCUR     SUDDEN SHORTNESS OF BREATH, CHEST PAIN, NUMBNESS OR TINGLING TO EITHER EXTREMITY IS A MEDICAL EMERGENCY, CALL 911 FOR TRANSPORT TO NEAREST [place]

## 2022-01-06 NOTE — OR PostOp (Signed)
950 a.m. Band-Aid to rul d&I  NO DR.  NO SUB-Q-TANEOUS EMPHYSEMA PALPATED. PATIENT DENIES SOB

## 2022-01-06 NOTE — Brief Op Note (Signed)
01/06/22      Radiologist: Dr. Lamar Blinks    Procedure: CT BIOPSY LUNG    Description of Procedure Findings:    Right upper lobe mass          Estimated Blood Loss:  1.0 mL        Specimen Collected:  3 core biopsy specimens            Diagnosis: Lung mass; as above; dictated report to follow      Lamar Blinks, MD

## 2022-01-07 DIAGNOSIS — C3411 Malignant neoplasm of upper lobe, right bronchus or lung: Secondary | ICD-10-CM

## 2022-01-14 LAB — AFB CULTURE WITH STAIN
AFB CULTURE: NO GROWTH
AFB SMEAR: NEGATIVE

## 2022-02-03 ENCOUNTER — Ambulatory Visit: Payer: Commercial Managed Care - PPO | Attending: HEMATOLOGY-ONCOLOGY

## 2022-02-03 ENCOUNTER — Other Ambulatory Visit: Payer: Self-pay

## 2022-02-03 ENCOUNTER — Encounter (HOSPITAL_COMMUNITY): Payer: Self-pay

## 2022-02-03 VITALS — BP 122/71 | HR 91 | Temp 97.6°F | Ht 63.0 in | Wt 91.1 lb

## 2022-02-03 DIAGNOSIS — Z859 Personal history of malignant neoplasm, unspecified: Secondary | ICD-10-CM | POA: Insufficient documentation

## 2022-02-03 DIAGNOSIS — Z806 Family history of leukemia: Secondary | ICD-10-CM | POA: Insufficient documentation

## 2022-02-03 DIAGNOSIS — C7970 Secondary malignant neoplasm of unspecified adrenal gland: Secondary | ICD-10-CM | POA: Insufficient documentation

## 2022-02-03 DIAGNOSIS — C3490 Malignant neoplasm of unspecified part of unspecified bronchus or lung: Secondary | ICD-10-CM | POA: Insufficient documentation

## 2022-02-03 DIAGNOSIS — F17210 Nicotine dependence, cigarettes, uncomplicated: Secondary | ICD-10-CM | POA: Insufficient documentation

## 2022-02-03 DIAGNOSIS — E46 Unspecified protein-calorie malnutrition: Secondary | ICD-10-CM | POA: Insufficient documentation

## 2022-02-03 DIAGNOSIS — G9520 Unspecified cord compression: Secondary | ICD-10-CM | POA: Insufficient documentation

## 2022-02-03 DIAGNOSIS — R52 Pain, unspecified: Secondary | ICD-10-CM | POA: Insufficient documentation

## 2022-02-03 DIAGNOSIS — Z801 Family history of malignant neoplasm of trachea, bronchus and lung: Secondary | ICD-10-CM | POA: Insufficient documentation

## 2022-02-03 DIAGNOSIS — K56609 Unspecified intestinal obstruction, unspecified as to partial versus complete obstruction: Secondary | ICD-10-CM | POA: Insufficient documentation

## 2022-02-03 MED ORDER — HYDROCODONE 5 MG-ACETAMINOPHEN 325 MG TABLET
1.0000 | ORAL_TABLET | ORAL | 0 refills | Status: DC | PRN
Start: 2022-02-03 — End: 2022-03-25

## 2022-02-03 NOTE — Progress Notes (Signed)
Department of Hematology/Oncology  History and Physical    Name: Rose Solomon  STM:[id number]  Date of Birth: 17-Jul-1950  Encounter Date: 02/03/2022    REFERRING PROVIDER:  No referring provider defined for this encounter.    TELEMEDICINE DOCUMENTATION:  Patient Location:  [facility], [facility] outpatient Hematology/Oncology [address], [place] [address]  Patient/family aware of provider location:  yes  Patient/family consent for telemedicine:  yes     REASON FOR OFFICE VISIT:  New patient for evaluation and management of stage III+ non-small-cell lung cancer    HISTORY OF PRESENT ILLNESS:  Rose Solomon is a 71 y.o. female who presents today for evaluation of non-small-cell lung cancer.  She states that she has had some pain behind the right scapula for more than a year.  Recent imaging showed her to have an abnormality in that area that was worrisome for malignancy.  She would a biopsy performed and it was confirmed to be non-small-cell lung cancer.  It was an adenocarcinoma and not squamous.  A PET-CT was performed to formalize her staging.  She was found to have mediastinal and supraclavicular lymphadenopathy, which would make this stage III.  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  The adrenal gland appeared to be the only site of distant involvement.    The patient has a high degree of anxiety regarding treatment.  She had a family member who did poorly with cancer treatment in the past.    ROS:   Review of Systems   Constitutional:  Negative for appetite change, chills and fatigue.   HENT:   Negative for sore throat and trouble swallowing.    Eyes:  Negative for eye problems.   Respiratory:  Negative for cough and shortness of breath.    Cardiovascular:  Negative for chest pain and leg swelling.   Gastrointestinal:  Negative for abdominal pain.   Genitourinary:  Negative for dysuria and hematuria.    Musculoskeletal:  Negative  for arthralgias and gait problem.   Skin:  Negative for rash.   Neurological:  Negative for gait problem.   Hematological:  Negative for adenopathy.   Psychiatric/Behavioral:  Negative for depression.       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)          Past Surgical History:   Procedure Laterality Date    BRONCHOSCOPY      HX APPENDECTOMY      HX CESAREAN SECTION      HX CHOLECYSTECTOMY      HX COLONOSCOPY      HX TUBAL LIGATION      NECK SURGERY           Social History     Socioeconomic History    Marital status: Married     Spouse name: Not on file    Number of children: Not on file    Years of education: Not on file    Highest education level: Not on file   Occupational History    Not on file   Tobacco Use    Smoking status: Every Day     Packs/day: 0.25     Types: Cigarettes    Smokeless tobacco: Never  Vaping Use    Vaping Use: Former   Substance and Sexual Activity    Alcohol use: Not Currently    Drug use: Not Currently    Sexual activity: Not on file   Other Topics Concern    Not on file   Social History Narrative    Not on file     Social Determinants of Health     Financial Resource Strain: Not on file   Transportation Needs: Not on file   Social Connections: Not on file   Intimate Partner Violence: Not on file   Housing Stability: Not on file     Family Medical History:       Problem Relation (Age of Onset)    Alzheimer's/Dementia Mother    Congestive Heart Failure Father    Leukemia Mother    Lung Cancer Sister            Current Outpatient Medications   Medication Sig    acetaminophen (TYLENOL) 500 mg Oral Tablet Take 1 Tablet (500 mg total) by mouth    hydrOXYzine HCL (ATARAX) 10 mg Oral Tablet      No Known Allergies    PHYSICAL EXAM:  Most Recent Vitals    Flowsheet Row Telemedicine from 02/03/2022 in Hematology/Oncology,   [facility]   Temperature 36.4 C (97.6 F) filed at... 02/03/2022 1425   Heart Rate 91 filed at... 02/03/2022 1425   Respiratory Rate --   BP (Non-Invasive) 122/71 filed at... 02/03/2022 1425   SpO2 95 % filed at... 02/03/2022 1425   Height 1.6 m (5\' 3" ) filed at... 02/03/2022 1425   Weight 41.3 kg (91 lb 1.6 oz) filed at... 02/03/2022 1425   BMI (Calculated) 16.17 filed at... 02/03/2022 1425   BSA (Calculated) 1.36 filed at... 02/03/2022 1425      ECOG Status: (1) Restricted in physically strenuous activity, ambulatory and able to do work of light nature   Physical Exam    DIAGNOSTIC DATA:  No results found for this or any previous visit (from the past 17520 hour(s)).    LABS:   CBC  Diff   Lab Results   Component Value Date/Time    WBC 7.2 11/28/2021 11:26 AM    HGB 14.4 11/28/2021 11:26 AM    HCT 42.5 11/28/2021 11:26 AM    PLTCNT 260 11/28/2021 11:26 AM    RBC 4.40 11/28/2021 11:26 AM    MCV 96.7 11/28/2021 11:26 AM    MCHC 33.8 11/28/2021 11:26 AM    MCH 32.7 (H) 11/28/2021 11:26 AM    RDW 13.3 11/28/2021 11:26 AM    MPV 8.5 11/28/2021 11:26 AM    Lab Results   Component Value Date/Time    PMNS 69 11/28/2021 11:26 AM    LYMPHOCYTES 23 (L) 11/28/2021 11:26 AM    EOSINOPHIL 1 11/28/2021 11:26 AM    MONOCYTES 6 11/28/2021 11:26 AM    BASOPHILS 1 11/28/2021 11:26 AM    BASOPHILS 0.10 11/28/2021 11:26 AM    PMNABS 5.00 11/28/2021 11:26 AM    LYMPHSABS 1.60 11/28/2021 11:26 AM    EOSABS 0.10 11/28/2021 11:26 AM    MONOSABS 0.40 11/28/2021 11:26 AM            ASSESSMENT:    ICD-10-CM    1. Malignant neoplasm of lung, unspecified laterality, unspecified part of lung (CMS HCC)  C34.90 Refer to Rad Onc- Dr. Olin Hauser      2. 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 Protein-calorie malnutrition, unspecified severity (CMS [place])  E46       4. Colonic obstruction (CMS HCC)  K56.609            PLAN:   1. All relevant medical records were reviewed including available pertinent provider notes, procedure  notes, imaging, laboratory, and pathology.   2. All pertinent labs and/or imaging were reviewed with the patient.   3. Non-small-cell lung cancer, stage III+.  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  I have recommended consideration of combined chemoradiation, followed by adjuvant durvalumab.  The adrenal metastasis can be dealt with with stereotactic radiosurgery at any point the process.  We will go ahead and submit a referral to Radiation Oncology in order to begin that process.  All questions were answered to the satisfaction of the patient and her family.  She would like some time to consider the information and will let us know in the next day or 2 whether she would like to proceed.  4. Pain:  She reports that she does have some pain in the area of the original site of involvement.  We will go ahead and submit a prescription for hydrocodone and we discussed that her pain should improve once radiation treatment begins should she choose to proceed forward.    Rose Solomon was given the chance to ask questions, and these were answered to their satisfaction. The patient is welcome to call with any questions or concerns in the meantime.     On the day of the encounter, a total of 50 minutes was spent on this patient encounter including review of historical information, examination, documentation and post-visit activities.   No follow-ups on file.     Narda Rutherford, MD  02/03/2022, 15:27  The patient was seen as part of a collaborative telemedicine service with Dr. Jake Shark who participated in the encounter by active presence via approved video/audio means for portions of the encounter.  The patient's insurance company bears full legal and financial responsibility resulting from any deviations that they cause to my recommended treatment plan.     CC:  Adair Laundry, DO  [place] [address]    No referring provider defined for this  encounter.    This note was partially generated using MModal Fluency Direct system, and there may be some incorrect words, spellings, and punctuation that were not noted in checking the note before saving.

## 2022-02-04 ENCOUNTER — Encounter (HOSPITAL_COMMUNITY): Payer: Self-pay | Admitting: HEMATOLOGY-ONCOLOGY

## 2022-02-04 NOTE — Progress Notes (Addendum)
Late entry 02/03/22:    New Patient Visit      Diagnosis: [place] disease specific written information provided and verbally reviewed.  Patient assessed for barriers to learning, language, learning preference, and teaching needs.      Patient completed psychosocial distress screening using the Enhanced NCCN Distress Thermometer (DT) Tool and self-reported an overall score of 4.     [place] telephone number provided with verbal instructions on how to access this support should the patient develop symptoms, side effects and when necessary go to a local emergency room.    Verbally reviewed that appointments may be rescheduled but missed appointments will be followed up with a phone call or letter from our clinic.     Oriented to the [facility]. Verbally reviewed support services offered at the [facility]: home medical supply service, chaplain, psychosocial/supportive care, registered dietician, financial counselors.        Caregiver Education  Patient has a Caregiver: Yes    Patient Caregiver: Halea Lieb    Caregiver phone number:[phone]    Patient gives verbal permission to the [facility] clinic staff to discuss medical information with the caregiver: Yes    Caregiver has been assessed for barriers to learning, language, learning preference, and teaching needs.        Notebook with educational handouts and resources given to patient, verbalized understanding. Instructed patient to call for any questions or concerns.    Addison Lank, RN  02/04/2022, 08:15 New Patient Visit

## 2022-02-20 ENCOUNTER — Encounter (HOSPITAL_COMMUNITY): Payer: Self-pay | Admitting: HEMATOLOGY-ONCOLOGY

## 2022-02-20 ENCOUNTER — Ambulatory Visit: Payer: Commercial Managed Care - PPO | Attending: HEMATOLOGY-ONCOLOGY | Admitting: HEMATOLOGY-ONCOLOGY

## 2022-02-20 ENCOUNTER — Other Ambulatory Visit: Payer: Self-pay

## 2022-02-20 VITALS — BP 119/77 | HR 97 | Temp 97.0°F | Ht 63.0 in | Wt 89.3 lb

## 2022-02-20 DIAGNOSIS — Z85118 Personal history of other malignant neoplasm of bronchus and lung: Secondary | ICD-10-CM | POA: Insufficient documentation

## 2022-02-20 DIAGNOSIS — C3490 Malignant neoplasm of unspecified part of unspecified bronchus or lung: Secondary | ICD-10-CM | POA: Insufficient documentation

## 2022-02-20 DIAGNOSIS — Z859 Personal history of malignant neoplasm, unspecified: Secondary | ICD-10-CM | POA: Insufficient documentation

## 2022-02-20 DIAGNOSIS — Z806 Family history of leukemia: Secondary | ICD-10-CM | POA: Insufficient documentation

## 2022-02-20 DIAGNOSIS — R52 Pain, unspecified: Secondary | ICD-10-CM | POA: Insufficient documentation

## 2022-02-20 DIAGNOSIS — C7970 Secondary malignant neoplasm of unspecified adrenal gland: Secondary | ICD-10-CM | POA: Insufficient documentation

## 2022-02-20 DIAGNOSIS — Z801 Family history of malignant neoplasm of trachea, bronchus and lung: Secondary | ICD-10-CM | POA: Insufficient documentation

## 2022-02-20 DIAGNOSIS — F17210 Nicotine dependence, cigarettes, uncomplicated: Secondary | ICD-10-CM | POA: Insufficient documentation

## 2022-02-20 NOTE — Progress Notes (Unsigned)
Department of Hematology/Oncology  History and Physical    Name: Rose Solomon  BTD:[id number]  Date of Birth: Oct 11, 1950  Encounter Date: 02/20/2022    REFERRING PROVIDER:  Adair Laundry, [place],  [place] [address]    TELEMEDICINE DOCUMENTATION:  Patient Location:  [facility], [facility] outpatient Hematology/Oncology [address] [place] [address]  Patient/family aware of provider location:  yes  Patient/family consent for telemedicine:  yes     REASON FOR OFFICE VISIT:  New patient for evaluation and management of stage III+ non-small-cell lung cancer    HISTORY OF PRESENT ILLNESS:  Rose Solomon is a 71 y.o. female who presents today for evaluation of non-small-cell lung cancer.  She states that she has had some pain behind the right scapula for more than a year.  Recent imaging showed her to have an abnormality in that area that was worrisome for malignancy.  She would a biopsy performed and it was confirmed to be non-small-cell lung cancer.  It was an adenocarcinoma and not squamous.  A PET-CT was performed to formalize her staging.  She was found to have mediastinal and supraclavicular lymphadenopathy, which would make this stage III.  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  The adrenal gland appeared to be the only site of distant involvement.    The patient has a high degree of anxiety regarding treatment.  She had a family member who did poorly with cancer treatment in the past.    02/20/2022: The patient is here for follow up of non-small-cell lung cancer.  She is currently set up to have stereotactic radiation to the adrenal metastasis, followed by combined chemoradiation for the remainder of her disease.    ROS:   Review of Systems   Constitutional:  Negative for appetite change, chills and fatigue.   HENT:   Negative for sore throat and trouble swallowing.    Eyes:  Negative for eye problems.    Respiratory:  Negative for cough and shortness of breath.    Cardiovascular:  Negative for chest pain and leg swelling.   Gastrointestinal:  Negative for abdominal pain.   Genitourinary:  Negative for dysuria and hematuria.    Musculoskeletal:  Negative for arthralgias and gait problem.   Skin:  Negative for rash.   Neurological:  Negative for gait problem.   Hematological:  Negative for adenopathy.   Psychiatric/Behavioral:  Negative for depression.         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)          Past Surgical History:   Procedure Laterality Date    BRONCHOSCOPY      HX APPENDECTOMY      HX CESAREAN SECTION      HX CHOLECYSTECTOMY      HX COLONOSCOPY      HX TUBAL LIGATION      NECK SURGERY           Social History     Socioeconomic History    Marital status: Married     Spouse name: Not on file    Number of children: Not on file    Years of education: Not on file  Highest education level: Not on file   Occupational History    Not on file   Tobacco Use    Smoking status: Every Day     Packs/day: .25     Types: Cigarettes    Smokeless tobacco: Never   Vaping Use    Vaping Use: Former   Substance and Sexual Activity    Alcohol use: Not Currently    Drug use: Not Currently    Sexual activity: Not on file   Other Topics Concern    Not on file   Social History Narrative    Not on file     Social Determinants of Health     Financial Resource Strain: Not on file   Transportation Needs: Not on file   Social Connections: Not on file   Intimate Partner Violence: Not on file   Housing Stability: Not on file     Family Medical History:       Problem Relation (Age of Onset)    Alzheimer's/Dementia Mother    Congestive Heart Failure Father    Leukemia Mother    Lung Cancer Sister            Current Outpatient Medications   Medication Sig     acetaminophen (TYLENOL) 500 mg Oral Tablet Take 1 Tablet (500 mg total) by mouth    HYDROcodone-acetaminophen (NORCO) 5-325 mg Oral Tablet Take 1 Tablet by mouth Every 4 hours as needed for Pain    hydrOXYzine HCL (ATARAX) 10 mg Oral Tablet      No Known Allergies    PHYSICAL EXAM:  Most Recent Vitals    Flowsheet Row Telemedicine from 02/03/2022 in Hematology/Oncology,   [facility]   Temperature 36.4 C (97.6 F) filed at... 02/03/2022 1425   Heart Rate 91 filed at... 02/03/2022 1425   Respiratory Rate --   BP (Non-Invasive) 122/71 filed at... 02/03/2022 1425   SpO2 95 % filed at... 02/03/2022 1425   Height 1.6 m (5\' 3" ) filed at... 02/03/2022 1425   Weight 41.3 kg (91 lb 1.6 oz) filed at... 02/03/2022 1425   BMI (Calculated) 16.17 filed at... 02/03/2022 1425   BSA (Calculated) 1.36 filed at... 02/03/2022 1425      ECOG Status: (1) Restricted in physically strenuous activity, ambulatory and able to do work of light nature   Physical Exam    DIAGNOSTIC DATA:  No results found for this or any previous visit (from the past 17520 hour(s)).    LABS:   CBC  Diff   Lab Results   Component Value Date/Time    WBC 7.2 11/28/2021 11:26 AM    HGB 14.4 11/28/2021 11:26 AM    HCT 42.5 11/28/2021 11:26 AM    PLTCNT 260 11/28/2021 11:26 AM    RBC 4.40 11/28/2021 11:26 AM    MCV 96.7 11/28/2021 11:26 AM    MCHC 33.8 11/28/2021 11:26 AM    MCH 32.7 (H) 11/28/2021 11:26 AM    RDW 13.3 11/28/2021 11:26 AM    MPV 8.5 11/28/2021 11:26 AM    Lab Results   Component Value Date/Time    PMNS 69 11/28/2021 11:26 AM    LYMPHOCYTES 23 (L) 11/28/2021 11:26 AM    EOSINOPHIL 1 11/28/2021 11:26 AM    MONOCYTES 6 11/28/2021 11:26 AM    BASOPHILS 1 11/28/2021 11:26 AM    BASOPHILS 0.10 11/28/2021 11:26 AM    PMNABS 5.00 11/28/2021 11:26 AM    LYMPHSABS 1.60 11/28/2021 11:26 AM  EOSABS 0.10 11/28/2021 11:26 AM    MONOSABS 0.40 11/28/2021 11:26 AM            ASSESSMENT:    ICD-10-CM    1. Malignant neoplasm of lung, unspecified  laterality, unspecified part of lung (CMS HCC)  C34.90            PLAN:   1. All relevant medical records were reviewed including available pertinent provider notes, procedure notes, imaging, laboratory, and pathology.   2. All pertinent labs and/or imaging were reviewed with the patient.   3. Non-small-cell lung cancer, stage III+.  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  I have recommended consideration of combined chemoradiation, followed by adjuvant durvalumab.  She is currently set up to have the adrenal metastasis treated with stereotactic radiation.  We discussed the use of chemoradiation followed by durvalumab in order to give her the potential for long-term disease-free survival.  I will tentatively plan on seeing her back in 3 weeks in order to move forward with the combined modality portion of the plan.  4. Pain:  She reports that she does have some pain in the area of the original site of involvement.  Hydrocodone was prescribed previously.    JOZLYN SCHATZ was given the chance to ask questions, and these were answered to their satisfaction. The patient is welcome to call with any questions or concerns in the meantime.     On the day of the encounter, a total of 35 minutes was spent on this patient encounter including review of historical information, examination, documentation and post-visit activities.   Return in about 3 weeks (around 03/13/2022).     Narda Rutherford, MD  02/20/2022, 16:14  The patient was seen as part of a collaborative telemedicine service with Dr. Jake Shark who participated in the encounter by active presence via approved video/audio means for portions of the encounter.  The patient's insurance company bears full legal and financial responsibility resulting from any deviations that they cause to my recommended treatment plan.     CC:  Adair Laundry, DO  [address]  [place] [address]    [place],  [place],  [place] [address]    This note was partially generated using MModal Fluency Direct system, and there may be some incorrect words, spellings, and punctuation that were not noted in checking the note before saving.

## 2022-02-21 ENCOUNTER — Encounter (HOSPITAL_COMMUNITY): Payer: Self-pay | Admitting: HEMATOLOGY-ONCOLOGY

## 2022-02-21 NOTE — Progress Notes (Addendum)
Consent obtained by Marzetta Board and scanned into chart.    Patient and spouse given written information regarding MPOA and LW per request.

## 2022-02-21 NOTE — Progress Notes (Signed)
Per Dr Jake Shark, he spoke with Dr Olin Hauser and said that he could do stereotactic on the adrenal next week and start chemo/XRT the next. Secure chat sent to Scherrie November in infusion and [place]. Patient has concerns regarding copay, secure chat sent to [facility] for assist with copay.

## 2022-02-21 NOTE — Progress Notes (Signed)
I spoke with Lonn Georgia at Dr Glenda Chroman office to see when patients radiation would be starting. Per Lonn Georgia patient has appointment on 10/17 and they are unable to schedule any sooner, Dr Olin Hauser and physics will be out several days in October. Dr Jake Shark notified. I called patient to see if she will be agreeable to start chemotherapy prior to radiation. No answer, message left to return call.

## 2022-02-24 ENCOUNTER — Other Ambulatory Visit (HOSPITAL_COMMUNITY): Payer: Self-pay | Admitting: HEMATOLOGY-ONCOLOGY

## 2022-02-24 ENCOUNTER — Encounter (HOSPITAL_COMMUNITY): Payer: Self-pay | Admitting: HEMATOLOGY-ONCOLOGY

## 2022-02-24 DIAGNOSIS — C3490 Malignant neoplasm of unspecified part of unspecified bronchus or lung: Secondary | ICD-10-CM

## 2022-02-24 NOTE — Progress Notes (Addendum)
I spoke with Rose Solomon at Dr Glenda Chroman office, patient is to start adrenal stereotactic radiation 10/3 thru 10/6 and Lung on 10/10. I sent secure chat to Suanne Marker and Maudie Mercury in infusion to schedule chemo with radiation to lung and message to [facility] for authorization.

## 2022-02-25 ENCOUNTER — Encounter (HOSPITAL_COMMUNITY): Payer: Self-pay | Admitting: HEMATOLOGY-ONCOLOGY

## 2022-02-25 ENCOUNTER — Telehealth (HOSPITAL_COMMUNITY): Payer: Self-pay | Admitting: HEMATOLOGY-ONCOLOGY

## 2022-02-25 ENCOUNTER — Other Ambulatory Visit: Payer: Commercial Managed Care - PPO | Attending: HEMATOLOGY-ONCOLOGY

## 2022-02-25 ENCOUNTER — Other Ambulatory Visit: Payer: Self-pay

## 2022-02-25 DIAGNOSIS — C3490 Malignant neoplasm of unspecified part of unspecified bronchus or lung: Secondary | ICD-10-CM | POA: Insufficient documentation

## 2022-02-25 LAB — CBC WITH DIFF
BASOPHIL #: 0 10*3/uL (ref 0.00–0.10)
BASOPHIL %: 1 % (ref 0–1)
EOSINOPHIL #: 0.1 10*3/uL (ref 0.00–0.50)
EOSINOPHIL %: 1 %
HCT: 40 % (ref 31.2–41.9)
HGB: 13.8 g/dL (ref 10.9–14.3)
LYMPHOCYTE #: 1.8 10*3/uL (ref 1.00–3.00)
LYMPHOCYTE %: 30 % (ref 16–44)
MCH: 33.7 pg — ABNORMAL HIGH (ref 24.7–32.8)
MCHC: 34.5 g/dL (ref 32.3–35.6)
MCV: 97.7 fL — ABNORMAL HIGH (ref 75.5–95.3)
MONOCYTE #: 0.5 10*3/uL (ref 0.30–1.00)
MONOCYTE %: 9 % (ref 5–13)
MPV: 9.4 fL (ref 7.9–10.8)
NEUTROPHIL #: 3.5 10*3/uL (ref 1.85–7.80)
NEUTROPHIL %: 59 % (ref 43–77)
PLATELETS: 249 10*3/uL (ref 140–440)
RBC: 4.09 10*6/uL (ref 3.63–4.92)
RDW: 13.3 % (ref 12.3–17.7)
WBC: 5.9 10*3/uL (ref 3.8–11.8)

## 2022-02-25 LAB — COMPREHENSIVE METABOLIC PANEL, NON-FASTING
ALBUMIN/GLOBULIN RATIO: 1.6 — ABNORMAL HIGH (ref 0.8–1.4)
ALBUMIN: 4.5 g/dL (ref 3.5–5.7)
ALKALINE PHOSPHATASE: 68 U/L (ref 34–104)
ALT (SGPT): 13 U/L (ref 7–52)
ANION GAP: 9 mmol/L (ref 4–13)
AST (SGOT): 25 U/L (ref 13–39)
BILIRUBIN TOTAL: 0.4 mg/dL (ref 0.3–1.2)
BUN/CREA RATIO: 20 (ref 6–22)
BUN: 13 mg/dL (ref 7–25)
CALCIUM, CORRECTED: 9.1 mg/dL (ref 8.9–10.8)
CALCIUM: 9.6 mg/dL (ref 8.6–10.3)
CHLORIDE: 108 mmol/L — ABNORMAL HIGH (ref 98–107)
CO2 TOTAL: 24 mmol/L (ref 21–31)
CREATININE: 0.65 mg/dL (ref 0.60–1.30)
ESTIMATED GFR: 95 mL/min/{1.73_m2} (ref >59)
GLOBULIN: 2.9 (ref 2.9–5.4)
GLUCOSE: 74 mg/dL (ref 74–109)
OSMOLALITY, CALCULATED: 280 mOsm/kg (ref 270–290)
POTASSIUM: 3.8 mmol/L (ref 3.5–5.1)
PROTEIN TOTAL: 7.4 g/dL (ref 6.4–8.9)
SODIUM: 141 mmol/L (ref 136–145)

## 2022-02-25 NOTE — Telephone Encounter (Signed)
Called and spoke with patient, told her she needed to come by the hospital after her radiation tx today and get lab work done on the first floor, to go to registration and get registered and someone would walk her down to the lab if she needed help finding it.

## 2022-02-28 ENCOUNTER — Encounter (HOSPITAL_COMMUNITY): Payer: Self-pay | Admitting: HEMATOLOGY-ONCOLOGY

## 2022-02-28 NOTE — Progress Notes (Signed)
Patient scheduled to start chemotherapy 10/10. I called Dr Glenda Chroman office and they have RT starting on 10/10. I called patient and spoke with her husband, husband notified that patient will need to go to radiation first at [facility] and then come to the Clinic and Infusion center for chemotherapy on 10/10 and any future dates that she will have chemotherapy with radiation.  I called [facility] and they are going to contact patient to get referral for [facility] A Cath scheduled.

## 2022-03-03 ENCOUNTER — Ambulatory Visit (INDEPENDENT_AMBULATORY_CARE_PROVIDER_SITE_OTHER): Payer: Self-pay | Admitting: Surgery

## 2022-03-03 ENCOUNTER — Encounter (HOSPITAL_COMMUNITY): Payer: Self-pay | Admitting: HEMATOLOGY-ONCOLOGY

## 2022-03-03 NOTE — Progress Notes (Addendum)
I called patient because our MA said patient had cancelled her appt with surgery today to discuss port. Patient stated she did not want any more treatment because she has felt confused since radiation last week. I called Dr Daphine Deutscher office and they stated the confusion would not be from the RT last week. Patient has appointment tomorrow so I called her back to see if she would be here. Patient requested I speak with her husband. He feels like the oxycodone is making her not think clearly and he is going to try to hold that for a few days. He states patient does not want to do a telemedicine visit and requested no treatment (chemotherapy or radiation) at this time. He requested I call back on Wednesday evening. He said he wants to respect her wishes. Encouraged husband to call office with any questions or concerns. Radiation, Selena Batten and Bjorn Loser in infusion center, and [place] notified.

## 2022-03-04 ENCOUNTER — Inpatient Hospital Stay (HOSPITAL_COMMUNITY): Payer: Commercial Managed Care - PPO

## 2022-03-04 ENCOUNTER — Inpatient Hospital Stay (HOSPITAL_COMMUNITY): Payer: Self-pay

## 2022-03-04 ENCOUNTER — Ambulatory Visit (HOSPITAL_COMMUNITY): Payer: Self-pay | Admitting: HEMATOLOGY-ONCOLOGY

## 2022-03-05 ENCOUNTER — Encounter (HOSPITAL_COMMUNITY): Payer: Self-pay | Admitting: HEMATOLOGY-ONCOLOGY

## 2022-03-05 NOTE — Progress Notes (Signed)
I called patient and spoke with her husband per her request. He states patient has been more oriented since stopping the Oxycodone and she is agreeable for treatment, including chemotherapy, radiation, and port placement. I called Dr Daphine Deutscher office and they stated after speaking with Dr Joslyn Devon that patient is to receive chemotherapy prior to RT. I called [facility] to reschedule consultation for Port-A-Cath. They have scheduled patient for 10/17 at 2pm and will notify patient. Secure chat sent to Abran Richard, and Harper in infusion center, [place], and Dr Damita Lack.

## 2022-03-06 ENCOUNTER — Encounter (HOSPITAL_COMMUNITY): Payer: Self-pay | Admitting: HEMATOLOGY-ONCOLOGY

## 2022-03-06 ENCOUNTER — Other Ambulatory Visit (HOSPITAL_COMMUNITY): Payer: Self-pay | Admitting: HEMATOLOGY-ONCOLOGY

## 2022-03-06 DIAGNOSIS — C3490 Malignant neoplasm of unspecified part of unspecified bronchus or lung: Secondary | ICD-10-CM

## 2022-03-06 MED ORDER — DEXAMETHASONE 4 MG TABLET
4.0000 mg | ORAL_TABLET | Freq: Two times a day (BID) | ORAL | 2 refills | Status: DC
Start: 2022-03-06 — End: 2022-03-06

## 2022-03-06 MED ORDER — FOLIC ACID 1 MG TABLET
1.0000 mg | ORAL_TABLET | Freq: Every day | ORAL | 2 refills | Status: AC
Start: 2022-03-06 — End: ?

## 2022-03-06 MED ORDER — FOLIC ACID 1 MG TABLET
1.0000 mg | ORAL_TABLET | Freq: Every day | ORAL | 2 refills | Status: DC
Start: 2022-03-06 — End: 2022-03-06

## 2022-03-06 MED ORDER — DEXAMETHASONE 4 MG TABLET
4.0000 mg | ORAL_TABLET | Freq: Two times a day (BID) | ORAL | 2 refills | Status: AC
Start: 2022-03-06 — End: ?

## 2022-03-06 NOTE — Progress Notes (Signed)
I called and spoke with patient and husband about the take home medications and when to start, verbalized understanding.

## 2022-03-06 NOTE — Progress Notes (Signed)
I spoke with Rose Solomon this morning to clarify RT plan. Per Davy Pique, Dr Olin Hauser notified them that patient would not receive radiation only chemotherapy. Dr Olin Hauser is out of the office until Monday, I will call on Monday to confirm.

## 2022-03-06 NOTE — Progress Notes (Signed)
Sonya called back to confirm they are going to wait for radiation until after chemo.

## 2022-03-06 NOTE — Progress Notes (Signed)
I called patient to see if she would be able to give self B12 injection as premedication to chemotherapy. She stated her and her husband are both "afraid" of needles and they have no other family members to give injection. They are willing to come to infusion center 6 days prior to each treatment. Kim in infusion notified in secure chat.

## 2022-03-11 ENCOUNTER — Inpatient Hospital Stay (HOSPITAL_COMMUNITY): Payer: Commercial Managed Care - PPO

## 2022-03-11 ENCOUNTER — Ambulatory Visit (INDEPENDENT_AMBULATORY_CARE_PROVIDER_SITE_OTHER): Payer: Commercial Managed Care - PPO | Admitting: Surgery

## 2022-03-11 ENCOUNTER — Ambulatory Visit (HOSPITAL_COMMUNITY): Payer: Self-pay | Admitting: HEMATOLOGY-ONCOLOGY

## 2022-03-11 ENCOUNTER — Other Ambulatory Visit: Payer: Self-pay

## 2022-03-11 ENCOUNTER — Encounter (INDEPENDENT_AMBULATORY_CARE_PROVIDER_SITE_OTHER): Payer: Self-pay | Admitting: Surgery

## 2022-03-11 VITALS — BP 118/94 | HR 94 | Temp 97.5°F | Wt 87.6 lb

## 2022-03-11 DIAGNOSIS — I878 Other specified disorders of veins: Secondary | ICD-10-CM

## 2022-03-11 DIAGNOSIS — C3491 Malignant neoplasm of unspecified part of right bronchus or lung: Secondary | ICD-10-CM

## 2022-03-12 ENCOUNTER — Ambulatory Visit (HOSPITAL_COMMUNITY): Payer: Self-pay | Admitting: HEMATOLOGY-ONCOLOGY

## 2022-03-14 ENCOUNTER — Inpatient Hospital Stay
Admission: RE | Admit: 2022-03-14 | Discharge: 2022-03-14 | Disposition: A | Discharge status: Home / Self Care | Payer: Commercial Managed Care - PPO | Attending: Surgery | Admitting: Surgery

## 2022-03-14 ENCOUNTER — Ambulatory Visit (HOSPITAL_COMMUNITY): Payer: Commercial Managed Care - PPO | Admitting: Anesthesiology

## 2022-03-14 ENCOUNTER — Other Ambulatory Visit: Payer: Self-pay

## 2022-03-14 ENCOUNTER — Other Ambulatory Visit (HOSPITAL_COMMUNITY): Payer: Commercial Managed Care - PPO

## 2022-03-14 ENCOUNTER — Encounter (HOSPITAL_COMMUNITY)
Admission: RE | Disposition: A | Discharge status: Home / Self Care | Payer: Self-pay | Source: Home / Self Care | Attending: Surgery

## 2022-03-14 ENCOUNTER — Telehealth (INDEPENDENT_AMBULATORY_CARE_PROVIDER_SITE_OTHER): Payer: Self-pay | Admitting: Surgery

## 2022-03-14 ENCOUNTER — Ambulatory Visit (HOSPITAL_COMMUNITY): Payer: Commercial Managed Care - PPO

## 2022-03-14 ENCOUNTER — Encounter (HOSPITAL_COMMUNITY): Payer: Commercial Managed Care - PPO | Admitting: Surgery

## 2022-03-14 ENCOUNTER — Encounter (HOSPITAL_COMMUNITY): Payer: Self-pay | Admitting: Surgery

## 2022-03-14 DIAGNOSIS — Z801 Family history of malignant neoplasm of trachea, bronchus and lung: Secondary | ICD-10-CM

## 2022-03-14 DIAGNOSIS — I872 Venous insufficiency (chronic) (peripheral): Secondary | ICD-10-CM | POA: Insufficient documentation

## 2022-03-14 DIAGNOSIS — C3491 Malignant neoplasm of unspecified part of right bronchus or lung: Secondary | ICD-10-CM | POA: Insufficient documentation

## 2022-03-14 DIAGNOSIS — F17210 Nicotine dependence, cigarettes, uncomplicated: Secondary | ICD-10-CM

## 2022-03-14 SURGERY — INSERTION PORT SUBCUTANEOUS
Anesthesia: Monitor Anesthesia Care | Site: Chest | Laterality: Left | Wound class: Clean Wound: Uninfected operative wounds in which no inflammation occurred

## 2022-03-14 MED ORDER — ONDANSETRON HCL (PF) 4 MG/2 ML INJECTION SOLUTION
4.0000 mg | Freq: Four times a day (QID) | INTRAMUSCULAR | Status: DC | PRN
Start: 2022-03-14 — End: 2022-03-14

## 2022-03-14 MED ORDER — LACTATED RINGERS INTRAVENOUS SOLUTION
INTRAVENOUS | Status: DC
Start: 2022-03-14 — End: 2022-03-14

## 2022-03-14 MED ORDER — TRAMADOL 50 MG TABLET
1.0000 | ORAL_TABLET | ORAL | 1 refills | Status: AC | PRN
Start: 2022-03-14 — End: ?

## 2022-03-14 MED ORDER — ROPIVACAINE (PF) 2 MG/ML (0.2 %) INJECTION SOLUTION
Freq: Once | INTRAMUSCULAR | Status: DC | PRN
Start: 2022-03-14 — End: 2022-03-14
  Administered 2022-03-14: 20 mL via INTRAMUSCULAR

## 2022-03-14 MED ORDER — SODIUM CHLORIDE 0.9 % (FLUSH) INJECTION SYRINGE
3.0000 mL | INJECTION | INTRAMUSCULAR | Status: DC | PRN
Start: 2022-03-14 — End: 2022-03-14

## 2022-03-14 MED ORDER — ONDANSETRON 4 MG DISINTEGRATING TABLET
4.0000 mg | ORAL_TABLET | Freq: Three times a day (TID) | ORAL | 0 refills | Status: AC | PRN
Start: 2022-03-14 — End: ?

## 2022-03-14 MED ORDER — HEPARIN (PORCINE) 5,000 UNIT/ML INJECTION SOLUTION
Freq: Once | INTRAMUSCULAR | Status: DC | PRN
Start: 2022-03-14 — End: 2022-03-14
  Administered 2022-03-14: 5000 [IU]

## 2022-03-14 MED ORDER — FENTANYL (PF) 50 MCG/ML INJECTION SOLUTION
INTRAMUSCULAR | Status: AC
Start: 2022-03-14 — End: 2022-03-14
  Filled 2022-03-14: qty 2

## 2022-03-14 MED ORDER — SODIUM CHLORIDE 0.9 % (FLUSH) INJECTION SYRINGE
3.0000 mL | INJECTION | Freq: Three times a day (TID) | INTRAMUSCULAR | Status: DC
Start: 2022-03-14 — End: 2022-03-14

## 2022-03-14 MED ORDER — FENTANYL (PF) 50 MCG/ML INJECTION WRAPPER
25.0000 ug | INJECTION | INTRAMUSCULAR | Status: DC | PRN
Start: 2022-03-14 — End: 2022-03-14

## 2022-03-14 MED ORDER — ONDANSETRON HCL (PF) 4 MG/2 ML INJECTION SOLUTION
4.0000 mg | Freq: Once | INTRAMUSCULAR | Status: AC
Start: 2022-03-14 — End: 2022-03-14
  Administered 2022-03-14: 4 mg via INTRAVENOUS

## 2022-03-14 MED ORDER — ONDANSETRON HCL (PF) 4 MG/2 ML INJECTION SOLUTION
4.0000 mg | Freq: Once | INTRAMUSCULAR | Status: DC | PRN
Start: 2022-03-14 — End: 2022-03-14

## 2022-03-14 MED ORDER — ACETAMINOPHEN 325 MG TABLET
650.0000 mg | ORAL_TABLET | ORAL | Status: DC | PRN
Start: 2022-03-14 — End: 2022-03-14

## 2022-03-14 MED ORDER — SODIUM CHLORIDE 0.9 % INTRAVENOUS PIGGYBACK
2.0000 g | INJECTION | Freq: Once | INTRAVENOUS | Status: DC
Start: 2022-03-14 — End: 2022-03-14

## 2022-03-14 MED ORDER — KETOROLAC 30 MG/ML (1 ML) INJECTION SOLUTION
15.0000 mg | Freq: Four times a day (QID) | INTRAMUSCULAR | Status: DC | PRN
Start: 2022-03-14 — End: 2022-03-14

## 2022-03-14 MED ORDER — FAMOTIDINE (PF) 20 MG/2 ML INTRAVENOUS SOLUTION
INTRAVENOUS | Status: AC
Start: 2022-03-14 — End: 2022-03-14
  Filled 2022-03-14: qty 2

## 2022-03-14 MED ORDER — IPRATROPIUM 0.5 MG-ALBUTEROL 3 MG (2.5 MG BASE)/3 ML NEBULIZATION SOLN
3.0000 mL | INHALATION_SOLUTION | Freq: Once | RESPIRATORY_TRACT | Status: DC | PRN
Start: 2022-03-14 — End: 2022-03-14

## 2022-03-14 MED ORDER — SODIUM CHLORIDE 0.9 % INTRAVENOUS PIGGYBACK
1.0000 g | INJECTION | Freq: Once | INTRAVENOUS | Status: AC
Start: 2022-03-14 — End: 2022-03-14
  Administered 2022-03-14: 1 g via INTRAVENOUS

## 2022-03-14 MED ORDER — ONDANSETRON HCL (PF) 4 MG/2 ML INJECTION SOLUTION
INTRAMUSCULAR | Status: AC
Start: 2022-03-14 — End: 2022-03-14
  Filled 2022-03-14: qty 2

## 2022-03-14 MED ORDER — METOCLOPRAMIDE 5 MG/ML INJECTION SOLUTION
10.0000 mg | Freq: Four times a day (QID) | INTRAMUSCULAR | Status: DC | PRN
Start: 2022-03-14 — End: 2022-03-14

## 2022-03-14 MED ORDER — ROPIVACAINE (PF) 2 MG/ML (0.2 %) INJECTION SOLUTION
INTRAMUSCULAR | Status: AC
Start: 2022-03-14 — End: 2022-03-14
  Filled 2022-03-14: qty 20

## 2022-03-14 MED ORDER — DEXAMETHASONE SODIUM PHOSPHATE 4 MG/ML INJECTION SOLUTION
INTRAMUSCULAR | Status: AC
Start: 2022-03-14 — End: 2022-03-14
  Filled 2022-03-14: qty 1

## 2022-03-14 MED ORDER — SODIUM CHLORIDE 0.9 % INTRAVENOUS PIGGYBACK
INJECTION | INTRAVENOUS | Status: AC
Start: 2022-03-14 — End: 2022-03-14
  Filled 2022-03-14: qty 100

## 2022-03-14 MED ORDER — ALBUTEROL SULFATE 2.5 MG/3 ML (0.083 %) SOLUTION FOR NEBULIZATION
2.5000 mg | INHALATION_SOLUTION | Freq: Once | RESPIRATORY_TRACT | Status: DC | PRN
Start: 2022-03-14 — End: 2022-03-14

## 2022-03-14 MED ORDER — PROPOFOL 10 MG/ML IV BOLUS
INJECTION | Freq: Once | INTRAVENOUS | Status: DC | PRN
Start: 2022-03-14 — End: 2022-03-14
  Administered 2022-03-14 (×3): 20 mg via INTRAVENOUS

## 2022-03-14 MED ORDER — DEXAMETHASONE SODIUM PHOSPHATE 4 MG/ML INJECTION SOLUTION
2.0000 mg | Freq: Once | INTRAMUSCULAR | Status: AC
Start: 2022-03-14 — End: 2022-03-14
  Administered 2022-03-14: 2 mg via INTRAVENOUS

## 2022-03-14 MED ORDER — FAMOTIDINE (PF) 20 MG/2 ML INTRAVENOUS SOLUTION
10.0000 mg | Freq: Once | INTRAVENOUS | Status: AC
Start: 2022-03-14 — End: 2022-03-14
  Administered 2022-03-14: 10 mg via INTRAVENOUS

## 2022-03-14 MED ORDER — HYDROMORPHONE 2 MG/ML INJECTION WRAPPER
0.2000 mg | INJECTION | INTRAMUSCULAR | Status: DC | PRN
Start: 2022-03-14 — End: 2022-03-14

## 2022-03-14 MED ORDER — HEPARIN (PORCINE) 5,000 UNIT/ML INJECTION SOLUTION
INTRAMUSCULAR | Status: AC
Start: 2022-03-14 — End: 2022-03-14
  Filled 2022-03-14: qty 1

## 2022-03-14 MED ORDER — CEFAZOLIN 1 GRAM SOLUTION FOR INJECTION
INTRAMUSCULAR | Status: AC
Start: 2022-03-14 — End: 2022-03-14
  Filled 2022-03-14: qty 20

## 2022-03-14 MED ORDER — HYDROCODONE 5 MG-ACETAMINOPHEN 325 MG TABLET
1.0000 | ORAL_TABLET | ORAL | Status: DC | PRN
Start: 2022-03-14 — End: 2022-03-14

## 2022-03-14 MED ORDER — FENTANYL (PF) 50 MCG/ML INJECTION WRAPPER
INJECTION | Freq: Once | INTRAMUSCULAR | Status: DC | PRN
Start: 2022-03-14 — End: 2022-03-14
  Administered 2022-03-14 (×4): 25 ug via INTRAVENOUS

## 2022-03-14 SURGICAL SUPPLY — 53 items
ADH LIQUID LF  WTPRF VIAL PREP NONSTAIN MASTISOL STYRAX GUM MASTIC ALC MTHY SLCYT STRL CLR NHZR 2/3 (WOUND CARE SUPPLY) ×1 IMPLANT
ADH LQ LF VIAL AMP PREP MASTI_SOL STYRAX GUM MASTIC ALC MTHY (WOUND CARE/ENTEROSTOMAL SUPPLY) ×1
BLADE 15 2 END CBNSTL SURG STRL DISP (SURGICAL CUTTING SUPPLIES) ×1 IMPLANT
CLEANER INSTR PREPZYME MUL-TRD CONTAINR NARSL NEUT PH BDGR (MISCELLANEOUS PT CARE ITEMS) ×1
CLEANER INSTR PREPZYME MUL-TRD CONTAINR NARSL NEUT PH BDGR 22OZ (MISCELLANEOUS PT CARE ITEMS) ×1
CLOSURE SKIN STRIPS 1/2X4IN_R1547 6/PK 50PK/BX (WOUND CARE/ENTEROSTOMAL SUPPLY) ×1
CONV USE 102436 - NEEDLE HYPO  22GA 1.5IN STD MONOJECT SS POLYPROP REG BVL LL HUB UL SHRP ANTICORE BLU STRL LF  DISP (MED SURG SUPPLIES) ×2 IMPLANT
CONV USE ITEM 321837 - GLOVE SURG 7.5 LTX PF NONST CRM (GLOVES AND ACCESSORIES) ×1 IMPLANT
CONV USE ITEM 321863 - GLOVE SURG 6.5 LF PF SMOOTH STRL GRN  PLISPRN MICRO (GLOVES AND ACCESSORIES) ×1 IMPLANT
CONV USE ITEM 329146 - CLEANER INSTR PREPZYME MUL-TRD CONTAINR NARSL NEUT PH BDGR 22OZ (MISCELLANEOUS PT CARE ITEMS) ×1 IMPLANT
CONV USE ITEM 46657 - SUTURE 2-0 V-20 POLYSRB 30IN VIOL BRD COAT ABS (SUTURE/WOUND CLOSURE) ×1 IMPLANT
COUNTER 20 CNT BLOCK ADH NEEDLE STRL LF  RD SHARP FOAM 15.75X11.5X14IN DISP (MED SURG SUPPLIES) ×1 IMPLANT
COUNTER 20 CNT BLOCK ADH NEEDLE STRL LF RD SHARP FOAM 15.75 (MED SURG SUPPLIES) ×1
COVER 53X24IN MAYOSTAND PRXM STRL DISP EQP SMS LF (DRAPE/PACKS/SHEETS/OR TOWEL) ×1 IMPLANT
COVER TBL 90X50IN STD SMS REINF FNFLD STRL LF  DISP (DRAPE/PACKS/SHEETS/OR TOWEL) ×2 IMPLANT
COVER TBL 90X50IN STD SMS REINF FNFLD STRL LF DISP (DRAPE/PACKS/SHEETS/OR TOWEL) ×2
DCNTR FLUID DISPENSR BAG BAJ DISP STRL LF  ASPT TRANSF (IV TUBING & ACCESSORIES) ×1 IMPLANT
DCNTR FLUID DSPNSR BAG BAJ DIS_P STRL ASPT TRANSF (IV TUBING & ACCESSORIES) ×1
DRAPE FNFLD ABS REINF 77X53IN 43528 PRXM LF  STRL DISP SURG SMS 44X23IN (DRAPE/PACKS/SHEETS/OR TOWEL) ×1 IMPLANT
DRAPE FNFLD ABS REINF 77X53IN_43528 PRXM LF STRL DISP SURG (DRAPE/PACKS/SHEETS/OR TOWEL) ×1
DRAPE MAYOSTAND CVR 53X24IN PR_XM LF STRL DISP EQP SMS (DRAPE/PACKS/SHEETS/OR TOWEL) ×1
DRESS TRNSPR 4.75X4.75IN CHG G_EL PAD NTCH STRP TGDRM 2 7/16 (WOUND CARE SUPPLY) ×2 IMPLANT
GLOVE SURG 7 LF  PF BEAD CUF STRL CRM 11.8IN PROTEXIS PI (GLOVES AND ACCESSORIES) ×1
GLOVE SURG 7 LF  PF BEAD CUF STRL CRM 11.8IN PROTEXIS PI PLISPRN THK9.1 MIL (GLOVES AND ACCESSORIES) ×1 IMPLANT
GLOVE SURG 7 LF  PF SMOOTH TXTR BEAD CUF STRL GRN 12IN SENSICARE PI PLISPRN SYN PLMR ALOE THK7.9 MIL (GLOVES AND ACCESSORIES) ×1 IMPLANT
GLOVE SURG 7 LF PF SMOOTH TXTR BEAD CUF STRL GRN 12IN (GLOVES AND ACCESSORIES) ×1
GLOVE SURG 7.5 LTX PF NONST CRM (GLOVES AND ACCESSORIES) ×1
GLOVE SURG 7.5 LTX PF SMOOTH STRL CRM (GLOVES AND ACCESSORIES) ×1
GOWN SURG LRG STD LGTH REG L3 NONREINFORCE BRTHBL TWL STRL (DRAPE/PACKS/SHEETS/OR TOWEL) ×2
GOWN SURG LRG STD LGTH REG L3 NONREINFORCE BRTHBL TWL STRL LF  DISP BLU HALYARD SPECTRUM SMS (DRAPE/PACKS/SHEETS/OR TOWEL) ×2 IMPLANT
LABEL MED CORRECT MED LABELING SYS 4 FLG 2 SHEET 24 PRPRNT (MED SURG SUPPLIES) ×1
LABEL MED CORRECT MED LABELING SYS 4 FLG 2 SHEET 24 PRPRNT STRL (MED SURG SUPPLIES) ×1 IMPLANT
NEEDLE HYPO  22GA 1.5IN STD MONOJECT SS POLYPROP REG BVL LL HUB UL SHRP ANTICORE BLU STRL LF  DISP (MED SURG SUPPLIES) ×2
NEEDLE HYPO 22GA 1.5IN STD MONOJECT SS POLYPROP REG BVL LL (MED SURG SUPPLIES) ×2
PACK SURG UBR BCK TBL CVR ZN REINF MAYO STAND CVR STRL DISP (CUSTOM TRAYS & PACK) ×1
PACK SURG UBR BCK TBL CVR ZN REINF MAYO STAND CVR STRL DISP 90X44IN 54X23IN LF (CUSTOM TRAYS & PACK) ×1 IMPLANT
PORT IMPL INFUS POWERPORT CLRVU ARGD SIL POLYUR 8FR 1 LUM LTWT INTMD STRL LF ×1 IMPLANT
SHEARS ESURG 9CM HARMON FOCUS+ CURVE TAPER 2 HNDCNTL SFT GRIP STRL DISP (ENDOSCOPIC SUPPLIES) ×1 IMPLANT
SHEARS ESURG 9CM HARMON FOCUS_CURVE SCISSOR GRIP BLADE REPRO (INSTRUMENTS ENDOMECHANICAL) ×1
SLEEVE COMPRESS MED KNEE LGTH KENDALL SCD SEQ NONST LF  DISP 21- IN DVT PE (MED SURG SUPPLIES) ×1 IMPLANT
SLEEVE COMPRESS MED KNEE LGTH KENDALL SCD SEQ NONST LF DISP (MED SURG SUPPLIES) ×1
SOL IV 0.9% NACL 500ML PLASTIC CONTAINR VIAFLEX LF (MEDICATIONS/SOLUTIONS) ×1 IMPLANT
SOLUTION IV NS INJ 500CC_2B1323Q 24/CS (MEDICATIONS/SOLUTIONS) ×1
SPONGE GAUZE 4X4IN AV GZ CLU COTTON ABS NWVN POSTOP LF  STRL DISP (WOUND CARE SUPPLY) ×1 IMPLANT
SPONGE GAUZE 4X4IN AV GZ CLU COTTON ABS NWVN POSTOP LF STRL (WOUND CARE/ENTEROSTOMAL SUPPLY) ×1
SPONGE SURG 4X4IN 16 PLY XRY DETECT COTTON STRL LF  DISP (WOUND CARE SUPPLY) ×1 IMPLANT
SPONGE SURG 4X4IN 16 PLY_RADOPQ COT STRL LF DISP (WOUND CARE/ENTEROSTOMAL SUPPLY) ×1
STRIP 4X.5IN STRSTRP PLSTR REINF SKNCLS WHT STRL LF (WOUND CARE SUPPLY) ×1 IMPLANT
SUTURE 2-0 V-20 POLYSRB 30IN VIOL BRD COAT ABS (SUTURE/WOUND CLOSURE) ×1
SUTURE 5-0 C-13 POLYSRB 30IN UNDYED BRD COAT ABS (SUTURE/WOUND CLOSURE) ×1 IMPLANT
SYRINGE LL 10ML LF  STRL GRAD N-PYRG DEHP-FR PVC FREE MED DISP (MED SURG SUPPLIES) ×2 IMPLANT
SYRINGE LL 10ML LF STRL MED D_ISP (MED SURG SUPPLIES) ×2
TOWEL 24X16IN COTTON BLU DISP SURG STRL LF (DRAPE/PACKS/SHEETS/OR TOWEL) ×1 IMPLANT

## 2022-03-14 NOTE — OR Surgeon (Signed)
[facility]      Patient Name: Rose Solomon, Rose Solomon Number: [id number]  Date of Service: 03/14/2022   Date of Birth: 11-Nov-1950      Pre-Operative Diagnosis: RIGHT LUNG CANCER; CONTINUATION OF TREATMENT     Post-Operative Diagnosis: RIGHT LUNG CANCER; CONTINUATION OF TREATMENT    Procedure(s)/Description:  LEFT SIDE PORTACATH INSERTION: 16580 (CPT)     Attending Surgeon: Willow Ora, MD     Anesthesia:  CRNA: Jasmine December, CRNA    Anesthesia Type: .Monitor Anesthesia Care     Estimated Blood Loss:  Minimal    The patient was brought in to the operating room, placed on the table in the supine position. The chest was then prepped and draped in a sterile manner and the infraclavicular region was infiltrated with local anesthetic. Then after adequate anesthesia the subclavian vein was entered and via the modified Seldinger technique the guide wire was inserted and confirmed to be in good position under fluoroscopy. The chest was then infiltrated with local anesthetic and a subcutaneous pocket was made. A curved tunneling device was used to make a subcutaneous tunnel, connecting the subcutaneous pocket with the guide wire insertion site. The catheter was then passed through the subcutaneous tunnel and then the introducer and sheath were passed over the guide wire and then the guide wire was removed, as well as the introducer, leaving the sheath in place. The tip of the catheter was inserted into the sheath, into the superior vena cava and then under fluoroscopic confirmation good position of the tip of the catheter was identified. The catheter was then cut, such that its length would approximate the level of the subcutaneous pocket. It was then attached to the subcutaneous port and sutured to the underlying fascia with 2-0 Vicryl sutures. Hemostasis was well obtained. The catheter was aspirated with good blood return and the catheter was flushed. The subcutaneous pocket was then closed with a  running 5-0 Vicryl suture in a buried subcuticular fashion. The superior incision at the guide wire entry site was closed with a single interrupted 5-0 Vicryl suture in a buried subcuticular fashion. The area was clean and dry and sterile dressing was applied over the incision. Patient tolerated the procedure well. No complications encountered    Jeanice Dempsey B Rayson Rando, MD,MBA,FACS

## 2022-03-14 NOTE — Anesthesia Transfer of Care (Signed)
ANESTHESIA TRANSFER OF CARE   Rose Solomon is a 71 y.o. 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)       Allergy History as of 03/14/22        No Known Allergies                  I completed my transfer of care / handoff to the receiving personnel during which we discussed:  Access, Airway, All key/critical aspects of case discussed, Analgesia, Antibiotics, Expectation of post procedure, Fluids/Product, Gave opportunity for questions and acknowledgement of understanding, Labs and PMHx      Post Location: PACU                                                             Last OR Temp: Temperature: 36.3 C (97.4 F)  ABG:  POTASSIUM   Date Value Ref Range Status   02/25/2022 3.8 3.5 - 5.1 mmol/L Final     CALCIUM   Date Value Ref Range Status   02/25/2022 9.6 8.6 - 10.3 mg/dL Final     Airway:* No LDAs found *  Blood pressure 129/81, pulse 71, temperature 36.3 C (97.4 F), resp. rate 16, height 1.6 m (5\' 3" ), weight (!) 39.9 kg (88 lb), SpO2 95%.

## 2022-03-14 NOTE — Telephone Encounter (Signed)
Rose Solomon from [facility] called. You sent in order for Tramadol today. Wanted to make sure you were aware that patient also has a prescription from Dr. Olin Hauser for Oxycodone.   Domingo Mend, LPN  20/35/5974 16:38

## 2022-03-14 NOTE — Discharge Instructions (Addendum)
Follow up with Dr. Renette Butters as needed.    May shower tomorrow evening.    Do not remove steri strips. Pat dry. They need to stay on for 2 weeks. Portacath may be accessed and deaccessed with steri strips in place.    No lifting, pulling or tugging with the left arm.    Call for any problems or concerns.    Please stop by your pharmacy and pick up your prescriptions.

## 2022-03-14 NOTE — Nurses Notes (Signed)
Radiology at bedside for x ray

## 2022-03-14 NOTE — Anesthesia Postprocedure Evaluation (Signed)
Anesthesia Post Op Evaluation    Patient: Rose Solomon  Procedure(s):  LEFT SIDE PORTACATH INSERTION    Last Vitals:Temperature: 36.3 C (97.4 F) (03/14/22 0809)  Heart Rate: 64 (03/14/22 1052)  BP (Non-Invasive): 120/88 (03/14/22 1052)  Respiratory Rate: 14 (03/14/22 1052)  SpO2: 100 % (03/14/22 1052)    No notable events documented.    Patient is sufficiently recovered from the effects of anesthesia to participate in the evaluation and has returned to their pre-procedure level.  Patient location during evaluation: PACU       Patient participation: complete - patient participated  Level of consciousness: awake and alert and responsive to verbal stimuli    Pain score: 0  Pain management: adequate  Airway patency: patent    Anesthetic complications: no  Cardiovascular status: acceptable  Respiratory status: acceptable  Hydration status: acceptable  Patient post-procedure temperature: Pt Normothermic   PONV Status: Absent

## 2022-03-14 NOTE — Telephone Encounter (Signed)
Per Dr. Renette Butters. Do not fill Ultram. Marybeth from [place] updated. Voiced understanding.   Domingo Mend, LPN  88/03/314 94:58

## 2022-03-14 NOTE — Anesthesia Preprocedure Evaluation (Signed)
ANESTHESIA PRE-OP EVALUATION  Planned Procedure: LEFT SIDE CENTRAL VENOUS PORTACATH INSERTION (Left: Chest)  Review of Systems     anesthesia history negative     patient summary reviewed  nursing notes reviewed        Pulmonary   COPD and current smoker,   Cardiovascular  negative cardio ROS,   No peripheral edema,  Exercise Tolerance: > or = 4 METS        GI/Hepatic/Renal   negative GI/hepatic/renal ROS,         Endo/Other   neg endo/other ROS,       Neuro/Psych/MS   negative neuro/psych ROS,      Cancer  CA,   lung cancer,               Physical Assessment      Airway       Mallampati: II    TM distance: <3 FB    Mouth Opening: good.            Dental                    Pulmonary    Breath sounds clear to auscultation  (-) no rhonchi, no decreased breath sounds, no wheezes, no rales and no stridor     Cardiovascular    Rhythm: regular  Rate: Normal  (-) no friction rub, carotid bruit is not present, no peripheral edema and no murmur     Other findings          Plan  ASA 3     Planned anesthesia type: general                       Intravenous induction     Anesthesia issues/risks discussed are: Dental Injuries, Stroke, Sore Throat and Cardiac Events/MI.  Anesthetic plan and risks discussed with patient             Patient's NPO status is appropriate for Anesthesia.

## 2022-03-14 NOTE — H&P (Signed)
[facility]  General Surgery  History and Physical    Date of Service:  03/14/2022  Angel, Hobdy, 71 y.o. female  Date of Admission:  03/14/2022  Date of Birth:  July 09, 1950  PCP: Adair Laundry, DO    Reason for admission:  Port-A-Cath insertion    HPI:  Rose Solomon is a 71 y.o. White female who is admitted for RIGHT LUNG CANCER; CONTINUATION OF TREATMENT .    Ms. Fulginiti presents today for Port-A-Cath insertion because of poor IV access in need of chemotherapy for right lung cancer.  The patient sees Drs. Mackey and [place].     Negative diabetes, blood thinner        Review of the result(s) of each unique test:  Patient underwent diagnostic testing ( none ) prior to this dates visit.  I have personally reviewed the results and that serves as a component of the medical decision making for this encounter        Review of prior external note(s) from each unique source:  Patients referral to this office including a recent assessment by the referring provider.  This was reviewed by me for this unique office visit for the indication and intent of the referral as well as any pertinent medical or surgical history relevant to the patients independent evaluation by me today.    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)       Past Surgical History:   Procedure Laterality Date    BRONCHOSCOPY      HX APPENDECTOMY      HX CESAREAN SECTION      HX CHOLECYSTECTOMY      HX COLONOSCOPY      HX TUBAL LIGATION      NECK SURGERY        Social History     Tobacco Use    Smoking status: Every Day     Packs/day: .25     Types: Cigarettes    Smokeless tobacco: Never   Vaping Use    Vaping Use: Former   Substance Use Topics    Alcohol use: Not Currently    Drug use: Not Currently       Family Medical History:       Problem Relation (Age of Onset)     Alzheimer's/Dementia Mother    Congestive Heart Failure Father    Leukemia Mother    Lung Cancer Sister           Medications Prior to Admission       Prescriptions    acetaminophen (TYLENOL) 500 mg Oral Tablet    Take 1 Tablet (500 mg total) by mouth Every 4 hours as needed    dexAMETHasone (DECADRON) 4 mg Oral Tablet    Take 1 Tablet (4 mg total) by mouth Twice daily The day before, day of, and day after chemotherapy.    Patient not taking:  Reported on 01/75/1025    folic acid (FOLVITE) 1 mg Oral Tablet    Take 1 Tablet (1 mg total) by mouth Once a day    Patient not taking:  Reported on 03/14/2022    HYDROcodone-acetaminophen (NORCO) 5-325 mg Oral Tablet    Take 1 Tablet by mouth Every 4 hours as needed for Pain  Patient not taking:  Reported on 03/14/2022           No Known Allergies       Patient Vitals for the past 24 hrs:   BP Temp Pulse Resp SpO2 Height Weight   03/14/22 0809 129/81 36.3 C (97.4 F) 71 16 95 % 1.6 m (5\' 3" ) (!) 39.9 kg (88 lb)          General: appropriate for age. in no acute distress.    Vital signs are present above and have been reviewed by me     HEENT: Atraumatic, Normocephalic. PERRLA, EOMI. Nose clear. Throat clear.    Lungs: Nonlabored breathing with symmetric expansion.  Clear to auscultation bilaterally    Heart:Regular wth respect to rate and rythmn.    Abdomen:Soft. Nontender. Nondistended and benign    Extremities:  Grossly normal with good range of motion and no major deformities.    Neuro:  Grossly normal motor and sensory function. CN's II through XII intact.    Psychiatric: Alert and oriented to person, place, and time. affect appropriate    Laboratory Data:     No results found for any visits on 03/14/22 (from the past 24 hour(s)).    Imaging Studies:    FLUORO C ARM IN OR < 60 MINS   Final Result by Shara Blazing, RT (R) (10/20 1050)           Assessment/Plan:  RIGHT LUNG CANCER; CONTINUATION OF TREATMENT    Poor IV access in need of chemotherapy  Left-sided  Port-A-Cath insertion scheduled for Friday March 14, 2022    This note was partially created using voice recognition software and is inherently subject to errors including those of syntax and "sound alike " substitutions which may escape proof reading. In such instances, original meaning may be extrapolated by contextual derivation.    Willow Ora, MD, MBA, FACS

## 2022-03-14 NOTE — Nurses Notes (Signed)
PT INSTRUCTED TO TAKE FOLIC ACID 1MG  DAILY PER DR. [facility] OFFICE

## 2022-03-17 ENCOUNTER — Telehealth (HOSPITAL_COMMUNITY): Payer: Self-pay | Admitting: HEMATOLOGY-ONCOLOGY

## 2022-03-17 ENCOUNTER — Telehealth (INDEPENDENT_AMBULATORY_CARE_PROVIDER_SITE_OTHER): Payer: Self-pay | Admitting: Surgery

## 2022-03-17 NOTE — Telephone Encounter (Signed)
Called [facility]  for status update. Was informed that testing started on 03-14-22 and takes about two weeks to finish. Dr Jake Shark notified.

## 2022-03-18 ENCOUNTER — Ambulatory Visit (HOSPITAL_COMMUNITY): Payer: Self-pay

## 2022-03-18 ENCOUNTER — Ambulatory Visit (HOSPITAL_COMMUNITY): Payer: Self-pay | Admitting: HEMATOLOGY-ONCOLOGY

## 2022-03-18 ENCOUNTER — Inpatient Hospital Stay (HOSPITAL_COMMUNITY): Payer: Commercial Managed Care - PPO

## 2022-03-25 ENCOUNTER — Inpatient Hospital Stay
Admission: RE | Admit: 2022-03-25 | Discharge: 2022-03-25 | Disposition: A | Discharge status: Still In Hospital | Payer: Commercial Managed Care - PPO | Source: Ambulatory Visit | Attending: HEMATOLOGY-ONCOLOGY | Admitting: HEMATOLOGY-ONCOLOGY

## 2022-03-25 ENCOUNTER — Other Ambulatory Visit: Payer: Self-pay

## 2022-03-25 ENCOUNTER — Ambulatory Visit (HOSPITAL_COMMUNITY): Payer: Commercial Managed Care - PPO | Admitting: HEMATOLOGY-ONCOLOGY

## 2022-03-25 ENCOUNTER — Inpatient Hospital Stay (HOSPITAL_COMMUNITY): Payer: Commercial Managed Care - PPO

## 2022-03-25 ENCOUNTER — Telehealth (HOSPITAL_COMMUNITY): Payer: Self-pay | Admitting: HEMATOLOGY-ONCOLOGY

## 2022-03-25 ENCOUNTER — Encounter (HOSPITAL_COMMUNITY): Payer: Self-pay | Admitting: HEMATOLOGY-ONCOLOGY

## 2022-03-25 VITALS — BP 118/69 | HR 104 | Temp 97.7°F | Ht 63.0 in | Wt 86.2 lb

## 2022-03-25 DIAGNOSIS — R52 Pain, unspecified: Secondary | ICD-10-CM | POA: Insufficient documentation

## 2022-03-25 DIAGNOSIS — F17210 Nicotine dependence, cigarettes, uncomplicated: Secondary | ICD-10-CM | POA: Insufficient documentation

## 2022-03-25 DIAGNOSIS — C7970 Secondary malignant neoplasm of unspecified adrenal gland: Secondary | ICD-10-CM | POA: Insufficient documentation

## 2022-03-25 DIAGNOSIS — R410 Disorientation, unspecified: Secondary | ICD-10-CM | POA: Insufficient documentation

## 2022-03-25 DIAGNOSIS — J449 Chronic obstructive pulmonary disease, unspecified: Secondary | ICD-10-CM | POA: Insufficient documentation

## 2022-03-25 DIAGNOSIS — R591 Generalized enlarged lymph nodes: Secondary | ICD-10-CM | POA: Insufficient documentation

## 2022-03-25 DIAGNOSIS — Z806 Family history of leukemia: Secondary | ICD-10-CM | POA: Insufficient documentation

## 2022-03-25 DIAGNOSIS — C3490 Malignant neoplasm of unspecified part of unspecified bronchus or lung: Secondary | ICD-10-CM | POA: Insufficient documentation

## 2022-03-25 DIAGNOSIS — Z801 Family history of malignant neoplasm of trachea, bronchus and lung: Secondary | ICD-10-CM | POA: Insufficient documentation

## 2022-03-25 LAB — CBC WITH DIFF
BASOPHIL #: 0 10*3/uL (ref 0.00–0.10)
BASOPHIL %: 0 % (ref 0–1)
EOSINOPHIL #: 0 10*3/uL (ref 0.00–0.50)
EOSINOPHIL %: 0 % — ABNORMAL LOW
HCT: 41.7 % (ref 31.2–41.9)
HGB: 14.1 g/dL (ref 10.9–14.3)
LYMPHOCYTE #: 0.5 10*3/uL — ABNORMAL LOW (ref 1.00–3.00)
LYMPHOCYTE %: 4 % — ABNORMAL LOW (ref 16–44)
MCH: 33.2 pg — ABNORMAL HIGH (ref 24.7–32.8)
MCHC: 33.8 g/dL (ref 32.3–35.6)
MCV: 98.3 fL — ABNORMAL HIGH (ref 75.5–95.3)
MONOCYTE #: 0.7 10*3/uL (ref 0.30–1.00)
MONOCYTE %: 5 % (ref 5–13)
MPV: 9 fL (ref 7.9–10.8)
NEUTROPHIL #: 13.3 10*3/uL — ABNORMAL HIGH (ref 1.85–7.80)
NEUTROPHIL %: 92 % — ABNORMAL HIGH (ref 43–77)
PLATELET COMMENT: NORMAL
PLATELETS: 256 10*3/uL (ref 140–440)
RBC COMMENT: NORMAL
RBC: 4.25 10*6/uL (ref 3.63–4.92)
RDW: 12.8 % (ref 12.3–17.7)
WBC: 14.5 10*3/uL — ABNORMAL HIGH (ref 3.8–11.8)

## 2022-03-25 LAB — COMPREHENSIVE METABOLIC PANEL, NON-FASTING
ALBUMIN/GLOBULIN RATIO: 1.4 (ref 0.8–1.4)
ALBUMIN: 4.5 g/dL (ref 3.5–5.7)
ALKALINE PHOSPHATASE: 68 U/L (ref 34–104)
ALT (SGPT): 16 U/L (ref 7–52)
ANION GAP: 8 mmol/L (ref 4–13)
AST (SGOT): 23 U/L (ref 13–39)
BILIRUBIN TOTAL: 0.3 mg/dL (ref 0.3–1.2)
BUN/CREA RATIO: 21 (ref 6–22)
BUN: 13 mg/dL (ref 7–25)
CALCIUM, CORRECTED: 9.8 mg/dL (ref 8.9–10.8)
CALCIUM: 10.3 mg/dL (ref 8.6–10.3)
CHLORIDE: 105 mmol/L (ref 98–107)
CO2 TOTAL: 23 mmol/L (ref 21–31)
CREATININE: 0.61 mg/dL (ref 0.60–1.30)
ESTIMATED GFR: 96 mL/min/{1.73_m2} (ref >59)
GLOBULIN: 3.2 (ref 2.9–5.4)
GLUCOSE: 117 mg/dL — ABNORMAL HIGH (ref 74–109)
OSMOLALITY, CALCULATED: 273 mOsm/kg (ref 270–290)
POTASSIUM: 3.8 mmol/L (ref 3.5–5.1)
PROTEIN TOTAL: 7.7 g/dL (ref 6.4–8.9)
SODIUM: 136 mmol/L (ref 136–145)

## 2022-03-25 LAB — THYROID STIMULATING HORMONE WITH FREE T4 REFLEX: TSH: 0.705 u[IU]/mL (ref 0.450–5.330)

## 2022-03-25 LAB — MAGNESIUM: MAGNESIUM: 2 mg/dL (ref 1.9–2.7)

## 2022-03-25 MED ORDER — DEXAMETHASONE 4 MG TABLET
4.0000 mg | ORAL_TABLET | Freq: Four times a day (QID) | ORAL | 1 refills | Status: AC
Start: 2022-03-25 — End: ?

## 2022-03-25 NOTE — Progress Notes (Unsigned)
Department of Hematology/Oncology  History and Physical    Name: Rose Solomon  FYB:[id number]  Date of Birth: 12-Mar-1951  Encounter Date: 03/25/2022    REFERRING PROVIDER:  No referring provider defined for this encounter.    REASON FOR OFFICE VISIT:  New patient for evaluation and management of stage III+ non-small-cell lung cancer    HISTORY OF PRESENT ILLNESS:  Rose Solomon is a 71 y.o. female who presents today for evaluation of non-small-cell lung cancer.  She states that she has had some pain behind the right scapula for more than a year.  Recent imaging showed her to have an abnormality in that area that was worrisome for malignancy.  She would a biopsy performed and it was confirmed to be non-small-cell lung cancer.  It was an adenocarcinoma and not squamous.  A PET-CT was performed to formalize her staging.  She was found to have mediastinal and supraclavicular lymphadenopathy, which would make this stage III.  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  The adrenal gland appeared to be the only site of distant involvement.    The patient has a high degree of anxiety regarding treatment.  She had a family member who did poorly with cancer treatment in the past.    02/20/2022: The patient is here for follow up of non-small-cell lung cancer.  She is currently set up to have stereotactic radiation to the adrenal metastasis, followed by combined chemoradiation for the remainder of her disease.    03/25/2022: The patient is here for follow up of non-small-cell lung cancer.  She had an additional delay in care due to a desire to not pursue treatment for awhile.  She is having more neurologic issues lately, and is more agreeable to considering treatment.  She is been having some difficulty using her left arm and has been getting intermittently more confused.    ROS:   Review of Systems   Constitutional:  Negative for appetite change, chills and fatigue.    HENT:   Negative for sore throat and trouble swallowing.    Eyes:  Negative for eye problems.   Respiratory:  Negative for cough and shortness of breath.    Cardiovascular:  Negative for chest pain and leg swelling.   Gastrointestinal:  Negative for abdominal pain.   Genitourinary:  Negative for dysuria and hematuria.    Musculoskeletal:  Negative for arthralgias and gait problem.   Skin:  Negative for rash.   Neurological:  Negative for gait problem.   Hematological:  Negative for adenopathy.   Psychiatric/Behavioral:  Negative for depression.         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)          Past Surgical History:   Procedure Laterality Date    BRONCHOSCOPY      HX APPENDECTOMY      HX CESAREAN SECTION      HX CHOLECYSTECTOMY      HX COLONOSCOPY      HX TUBAL LIGATION      NECK SURGERY           Social History     Socioeconomic History    Marital status: Married     Spouse name:  Not on file    Number of children: Not on file    Years of education: Not on file    Highest education level: Not on file   Occupational History    Not on file   Tobacco Use    Smoking status: Every Day     Packs/day: .25     Types: Cigarettes    Smokeless tobacco: Never   Vaping Use    Vaping Use: Former   Substance and Sexual Activity    Alcohol use: Not Currently    Drug use: Not Currently    Sexual activity: Not on file   Other Topics Concern    Not on file   Social History Narrative    Not on file     Social Determinants of Health     Financial Resource Strain: Not on file   Transportation Needs: Not on file   Social Connections: Not on file   Intimate Partner Violence: Not on file   Housing Stability: Not on file     Family Medical History:       Problem Relation (Age of Onset)    Alzheimer's/Dementia Mother    Congestive Heart Failure Father    Leukemia Mother     Lung Cancer Sister            Current Outpatient Medications   Medication Sig    acetaminophen (TYLENOL) 500 mg Oral Tablet Take 1 Tablet (500 mg total) by mouth Every 4 hours as needed    dexAMETHasone (DECADRON) 4 mg Oral Tablet Take 1 Tablet (4 mg total) by mouth Twice daily The day before, day of, and day after chemotherapy.    dexAMETHasone (DECADRON) 4 mg Oral Tablet Take 1 Tablet (4 mg total) by mouth Four times a day    folic acid (FOLVITE) 1 mg Oral Tablet Take 1 Tablet (1 mg total) by mouth Once a day    ondansetron (ZOFRAN ODT) 4 mg Oral Tablet, Rapid Dissolve Take 1 Tablet (4 mg total) by mouth Every 8 hours as needed for Nausea/Vomiting    ondansetron (ZOFRAN) 8 mg Oral Tablet     Oxycodone (ROXICODONE) 10 mg Oral Tablet     traMADoL (ULTRAM) 50 mg Oral Tablet Take 1 Tablet (50 mg total) by mouth Every 4 hours as needed for Pain     No Known Allergies    PHYSICAL EXAM:  Most Recent Vitals    Flowsheet Row Telemedicine from 02/03/2022 in Hematology/Oncology,   [facility]   Temperature 36.4 C (97.6 F) filed at... 02/03/2022 1425   Heart Rate 91 filed at... 02/03/2022 1425   Respiratory Rate --   BP (Non-Invasive) 122/71 filed at... 02/03/2022 1425   SpO2 95 % filed at... 02/03/2022 1425   Height 1.6 m (5\' 3" ) filed at... 02/03/2022 1425   Weight 41.3 kg (91 lb 1.6 oz) filed at... 02/03/2022 1425   BMI (Calculated) 16.17 filed at... 02/03/2022 1425   BSA (Calculated) 1.36 filed at... 02/03/2022 1425      ECOG Status: (1) Restricted in physically strenuous activity, ambulatory and able to do work of light nature   Physical Exam  Constitutional:       General: She is not in acute distress.     Appearance: Normal appearance.   Eyes:      Extraocular Movements: Extraocular movements intact.   Cardiovascular:      Rate and Rhythm: Normal rate and regular rhythm.   Pulmonary:  Effort: Pulmonary effort is normal.   Abdominal:      General: Abdomen is flat.      Palpations: Abdomen is  soft.   Musculoskeletal:         General: Normal range of motion.      Cervical back: Normal range of motion.   Skin:     General: Skin is warm and dry.   Neurological:      General: No focal deficit present.      Mental Status: She is alert.   Psychiatric:         Mood and Affect: Mood normal.         DIAGNOSTIC DATA:  No results found for this or any previous visit (from the past 17520 hour(s)).    LABS:   CBC  Diff   Lab Results   Component Value Date/Time    WBC 14.5 (H) 03/25/2022 08:43 AM    HGB 14.1 03/25/2022 08:43 AM    HCT 41.7 03/25/2022 08:43 AM    PLTCNT 256 03/25/2022 08:43 AM    RBC 4.25 03/25/2022 08:43 AM    MCV 98.3 (H) 03/25/2022 08:43 AM    MCHC 33.8 03/25/2022 08:43 AM    MCH 33.2 (H) 03/25/2022 08:43 AM    RDW 12.8 03/25/2022 08:43 AM    MPV 9.0 03/25/2022 08:43 AM    Lab Results   Component Value Date/Time    PMNS 92 (H) 03/25/2022 08:43 AM    LYMPHOCYTES 4 (L) 03/25/2022 08:43 AM    EOSINOPHIL 0 (L) 03/25/2022 08:43 AM    MONOCYTES 5 03/25/2022 08:43 AM    BASOPHILS 0 03/25/2022 08:43 AM    BASOPHILS 0.00 03/25/2022 08:43 AM    PMNABS 13.30 (H) 03/25/2022 08:43 AM    LYMPHSABS 0.50 (L) 03/25/2022 08:43 AM    EOSABS 0.00 03/25/2022 08:43 AM    MONOSABS 0.70 03/25/2022 08:43 AM            ASSESSMENT:    ICD-10-CM    1. Malignant neoplasm of lung, unspecified laterality, unspecified part of lung (CMS HCC)  C34.90 MRI BRAIN W/WO CONTRAST     MRI BRAIN W/WO CONTRAST           PLAN:   1. All relevant medical records were reviewed including available pertinent provider notes, procedure notes, imaging, laboratory, and pathology.   2. All pertinent labs and/or imaging were reviewed with the patient.   3. Non-small-cell lung cancer, stage III+.  The insurance company is hung up on the biomarker testing and refusing to approve chemotherapy until it is back.  We expect to have those results within the next week or so.  Due to the recent issues with her left arm, we will go ahead and empirically right for  Decadron 4 mg p.o. every 6 hours and arrange for a stat MRI of the brain to be performed to evaluate for the potential of metastatic disease.  4. Pain:  She reports that she does have some pain in the area of the original site of involvement.  Hydrocodone was prescribed previously.    Rose Solomon was given the chance to ask questions, and these were answered to their satisfaction. The patient is welcome to call with any questions or concerns in the meantime.     On the day of the encounter, a total of 35 minutes was spent on this patient encounter including review of historical information, examination, documentation and post-visit activities.   Return in about  1 week (around 04/01/2022).     Narda Rutherford, MD  03/25/2022, 09:50  The patient's insurance company bears full legal and financial responsibility resulting from any deviations that they cause to my recommended treatment plan.     CC:  Adair Laundry, DO  [place] [phone]    No referring provider defined for this encounter.    This note was partially generated using MModal Fluency Direct system, and there may be some incorrect words, spellings, and punctuation that were not noted in checking the note before saving.

## 2022-03-25 NOTE — Telephone Encounter (Signed)
Called [employer] to get PA for MRI that is scheduled for tomorrow. Got the authorization. Called MRI and gave them the number and also scheduling. Authorization number is [id number]. Authorization from [facility] that was faxed to office scanned into media. Attempted to call patient with appointment, no answer. Will attempt to call again before I leave today.

## 2022-03-25 NOTE — Telephone Encounter (Signed)
Called patient back and spoke to patient about appointment. Patient repeated back date and time.

## 2022-03-26 ENCOUNTER — Inpatient Hospital Stay
Admission: RE | Admit: 2022-03-26 | Discharge: 2022-03-26 | Disposition: A | Discharge status: Home / Self Care | Payer: Commercial Managed Care - PPO | Source: Ambulatory Visit | Attending: HEMATOLOGY-ONCOLOGY | Admitting: HEMATOLOGY-ONCOLOGY

## 2022-03-26 DIAGNOSIS — C3490 Malignant neoplasm of unspecified part of unspecified bronchus or lung: Secondary | ICD-10-CM

## 2022-03-26 MED ORDER — GADOBUTROL 10 MMOL/10 ML (1 MMOL/ML) INTRAVENOUS SOLUTION
10.0000 mL | INTRAVENOUS | Status: AC
Start: 2022-03-26 — End: 2022-03-26
  Administered 2022-03-26: 4 mL via INTRAVENOUS

## 2022-04-01 ENCOUNTER — Ambulatory Visit (HOSPITAL_COMMUNITY): Payer: Self-pay | Admitting: HEMATOLOGY-ONCOLOGY

## 2022-04-01 ENCOUNTER — Inpatient Hospital Stay (HOSPITAL_COMMUNITY): Payer: Commercial Managed Care - PPO

## 2022-04-03 ENCOUNTER — Ambulatory Visit (HOSPITAL_COMMUNITY): Payer: Self-pay

## 2022-04-03 LAB — SURGICAL PATHOLOGY SPECIMEN: Clinical History: POSITIVE

## 2022-04-08 ENCOUNTER — Inpatient Hospital Stay (HOSPITAL_COMMUNITY): Payer: Self-pay

## 2022-04-08 ENCOUNTER — Other Ambulatory Visit (HOSPITAL_COMMUNITY): Payer: Self-pay

## 2022-04-08 ENCOUNTER — Inpatient Hospital Stay (HOSPITAL_COMMUNITY): Payer: Commercial Managed Care - PPO

## 2022-04-14 ENCOUNTER — Other Ambulatory Visit (HOSPITAL_COMMUNITY): Payer: Self-pay | Admitting: HEMATOLOGY-ONCOLOGY

## 2022-04-15 ENCOUNTER — Ambulatory Visit (HOSPITAL_COMMUNITY): Payer: Self-pay | Admitting: HEMATOLOGY-ONCOLOGY

## 2022-04-15 ENCOUNTER — Encounter (HOSPITAL_COMMUNITY): Payer: Self-pay | Admitting: HEMATOLOGY-ONCOLOGY

## 2022-04-15 ENCOUNTER — Ambulatory Visit
Admission: RE | Admit: 2022-04-15 | Discharge: 2022-04-15 | Disposition: A | Discharge status: Home / Self Care | Payer: Commercial Managed Care - PPO | Source: Ambulatory Visit | Attending: HEMATOLOGY-ONCOLOGY | Admitting: HEMATOLOGY-ONCOLOGY

## 2022-04-15 ENCOUNTER — Ambulatory Visit: Payer: Commercial Managed Care - PPO | Admitting: HEMATOLOGY-ONCOLOGY

## 2022-04-15 ENCOUNTER — Encounter (HOSPITAL_COMMUNITY): Payer: Self-pay

## 2022-04-15 ENCOUNTER — Inpatient Hospital Stay (HOSPITAL_COMMUNITY): Payer: Commercial Managed Care - PPO

## 2022-04-15 ENCOUNTER — Other Ambulatory Visit: Payer: Self-pay

## 2022-04-15 ENCOUNTER — Ambulatory Visit (HOSPITAL_COMMUNITY)
Admission: RE | Admit: 2022-04-15 | Discharge: 2022-04-15 | Disposition: A | Discharge status: Home / Self Care | Payer: Commercial Managed Care - PPO | Source: Ambulatory Visit | Attending: HEMATOLOGY-ONCOLOGY | Admitting: HEMATOLOGY-ONCOLOGY

## 2022-04-15 ENCOUNTER — Other Ambulatory Visit (HOSPITAL_COMMUNITY): Payer: Self-pay | Admitting: HEMATOLOGY-ONCOLOGY

## 2022-04-15 VITALS — BP 91/62 | HR 69 | Temp 96.9°F | Resp 18

## 2022-04-15 VITALS — BP 101/66 | HR 81 | Temp 97.0°F | Ht 63.0 in | Wt 88.5 lb

## 2022-04-15 DIAGNOSIS — R52 Pain, unspecified: Secondary | ICD-10-CM | POA: Insufficient documentation

## 2022-04-15 DIAGNOSIS — Z923 Personal history of irradiation: Secondary | ICD-10-CM | POA: Insufficient documentation

## 2022-04-15 DIAGNOSIS — Z859 Personal history of malignant neoplasm, unspecified: Secondary | ICD-10-CM | POA: Insufficient documentation

## 2022-04-15 DIAGNOSIS — F17210 Nicotine dependence, cigarettes, uncomplicated: Secondary | ICD-10-CM | POA: Insufficient documentation

## 2022-04-15 DIAGNOSIS — Z5112 Encounter for antineoplastic immunotherapy: Secondary | ICD-10-CM | POA: Insufficient documentation

## 2022-04-15 DIAGNOSIS — C7931 Secondary malignant neoplasm of brain: Secondary | ICD-10-CM | POA: Insufficient documentation

## 2022-04-15 DIAGNOSIS — Z801 Family history of malignant neoplasm of trachea, bronchus and lung: Secondary | ICD-10-CM | POA: Insufficient documentation

## 2022-04-15 DIAGNOSIS — C7970 Secondary malignant neoplasm of unspecified adrenal gland: Secondary | ICD-10-CM | POA: Insufficient documentation

## 2022-04-15 DIAGNOSIS — Z5111 Encounter for antineoplastic chemotherapy: Secondary | ICD-10-CM | POA: Insufficient documentation

## 2022-04-15 DIAGNOSIS — C3490 Malignant neoplasm of unspecified part of unspecified bronchus or lung: Secondary | ICD-10-CM | POA: Insufficient documentation

## 2022-04-15 DIAGNOSIS — Z806 Family history of leukemia: Secondary | ICD-10-CM | POA: Insufficient documentation

## 2022-04-15 LAB — CBC WITH DIFF
BASOPHIL #: 0 10*3/uL (ref 0.00–0.10)
BASOPHIL %: 0 % (ref 0–1)
EOSINOPHIL #: 0 10*3/uL (ref 0.00–0.50)
EOSINOPHIL %: 0 % — ABNORMAL LOW
HCT: 46.6 % — ABNORMAL HIGH (ref 31.2–41.9)
HGB: 15.9 g/dL — ABNORMAL HIGH (ref 10.9–14.3)
LYMPHOCYTE #: 0.3 10*3/uL — ABNORMAL LOW (ref 1.00–3.00)
LYMPHOCYTE %: 2 % — ABNORMAL LOW (ref 16–44)
MCH: 33.6 pg — ABNORMAL HIGH (ref 24.7–32.8)
MCHC: 34.1 g/dL (ref 32.3–35.6)
MCV: 98.6 fL — ABNORMAL HIGH (ref 75.5–95.3)
MONOCYTE #: 0.3 10*3/uL (ref 0.30–1.00)
MONOCYTE %: 2 % — ABNORMAL LOW (ref 5–13)
MPV: 8 fL (ref 7.9–10.8)
NEUTROPHIL #: 11.8 10*3/uL — ABNORMAL HIGH (ref 1.85–7.80)
NEUTROPHIL %: 96 % — ABNORMAL HIGH (ref 43–77)
PLATELET COMMENT: NORMAL
PLATELETS: 165 10*3/uL (ref 140–440)
RBC COMMENT: NORMAL
RBC: 4.73 10*6/uL (ref 3.63–4.92)
RDW: 13.4 % (ref 12.3–17.7)
WBC: 12.3 10*3/uL — ABNORMAL HIGH (ref 3.8–11.8)

## 2022-04-15 LAB — COMPREHENSIVE METABOLIC PANEL, NON-FASTING
ALBUMIN/GLOBULIN RATIO: 1.6 — ABNORMAL HIGH (ref 0.8–1.4)
ALBUMIN: 3.9 g/dL (ref 3.5–5.7)
ALKALINE PHOSPHATASE: 56 U/L (ref 34–104)
ALT (SGPT): 47 U/L (ref 7–52)
ANION GAP: 9 mmol/L (ref 4–13)
AST (SGOT): 28 U/L (ref 13–39)
BILIRUBIN TOTAL: 0.6 mg/dL (ref 0.3–1.2)
BUN/CREA RATIO: 35 — ABNORMAL HIGH (ref 6–22)
BUN: 24 mg/dL (ref 7–25)
CALCIUM, CORRECTED: 9.3 mg/dL (ref 8.9–10.8)
CALCIUM: 9.2 mg/dL (ref 8.6–10.3)
CHLORIDE: 96 mmol/L — ABNORMAL LOW (ref 98–107)
CO2 TOTAL: 28 mmol/L (ref 21–31)
CREATININE: 0.68 mg/dL (ref 0.60–1.30)
ESTIMATED GFR: 94 mL/min/{1.73_m2} (ref >59)
GLOBULIN: 2.5 — ABNORMAL LOW (ref 2.9–5.4)
GLUCOSE: 160 mg/dL — ABNORMAL HIGH (ref 74–109)
OSMOLALITY, CALCULATED: 274 mOsm/kg (ref 270–290)
POTASSIUM: 3.7 mmol/L (ref 3.5–5.1)
PROTEIN TOTAL: 6.4 g/dL (ref 6.4–8.9)
SODIUM: 133 mmol/L — ABNORMAL LOW (ref 136–145)

## 2022-04-15 LAB — MAGNESIUM: MAGNESIUM: 2.2 mg/dL (ref 1.9–2.7)

## 2022-04-15 LAB — THYROID STIMULATING HORMONE WITH FREE T4 REFLEX: TSH: 1.279 u[IU]/mL (ref 0.450–5.330)

## 2022-04-15 MED ORDER — HYDROCORTISONE SOD SUCCINATE 100 MG/2 ML VIAL WRAPPER
100.0000 mg | Freq: Once | INTRAMUSCULAR | Status: DC | PRN
Start: 2022-04-15 — End: 2022-04-16

## 2022-04-15 MED ORDER — SODIUM CHLORIDE 0.9 % INTRAVENOUS SOLUTION
200.0000 mg | Freq: Once | INTRAVENOUS | Status: AC
Start: 2022-04-15 — End: 2022-04-15
  Administered 2022-04-15: 0 mg via INTRAVENOUS
  Administered 2022-04-15: 200 mg via INTRAVENOUS
  Filled 2022-04-15: qty 8

## 2022-04-15 MED ORDER — PROCHLORPERAZINE MALEATE 10 MG TABLET
10.0000 mg | ORAL_TABLET | Freq: Four times a day (QID) | ORAL | Status: DC | PRN
Start: 2022-04-15 — End: 2022-04-16

## 2022-04-15 MED ORDER — ALBUTEROL SULFATE HFA 90 MCG/ACTUATION AEROSOL INHALER - RN
2.0000 | Freq: Once | RESPIRATORY_TRACT | Status: DC | PRN
Start: 2022-04-15 — End: 2022-04-16

## 2022-04-15 MED ORDER — OLANZAPINE 5 MG TABLET
ORAL_TABLET | ORAL | Status: AC
Start: 2022-04-15 — End: 2022-04-15
  Filled 2022-04-15: qty 1

## 2022-04-15 MED ORDER — EPINEPHRINE HCL (PF) 1 MG/ML (1 ML) INJECTION SOLUTION
0.3000 mg | Freq: Once | INTRAMUSCULAR | Status: DC | PRN
Start: 2022-04-15 — End: 2022-04-16

## 2022-04-15 MED ORDER — SODIUM CHLORIDE 0.9 % INTRAVENOUS SOLUTION
500.0000 mg/m2 | Freq: Once | INTRAVENOUS | Status: AC
Start: 2022-04-15 — End: 2022-04-15
  Administered 2022-04-15: 675 mg via INTRAVENOUS
  Administered 2022-04-15: 0 mg via INTRAVENOUS
  Filled 2022-04-15: qty 8

## 2022-04-15 MED ORDER — OLANZAPINE 5 MG TABLET
5.0000 mg | ORAL_TABLET | Freq: Once | ORAL | Status: AC
Start: 2022-04-15 — End: 2022-04-15
  Administered 2022-04-15: 5 mg via ORAL

## 2022-04-15 MED ORDER — DIPHENHYDRAMINE 50 MG/ML INJECTION SOLUTION
25.0000 mg | Freq: Once | INTRAMUSCULAR | Status: DC | PRN
Start: 2022-04-15 — End: 2022-04-16

## 2022-04-15 MED ORDER — MEPERIDINE (PF) 25 MG/ML INJECTION SOLUTION
12.5000 mg | Freq: Once | INTRAMUSCULAR | Status: DC | PRN
Start: 2022-04-15 — End: 2022-04-16

## 2022-04-15 MED ORDER — CYANOCOBALAMIN (VIT B-12) 1,000 MCG/ML INJECTION SOLUTION
INTRAMUSCULAR | Status: AC
Start: 2022-04-15 — End: 2022-04-15
  Filled 2022-04-15: qty 1

## 2022-04-15 MED ORDER — PROCHLORPERAZINE EDISYLATE 10 MG/2 ML (5 MG/ML) INJECTION SOLUTION
10.0000 mg | Freq: Four times a day (QID) | INTRAMUSCULAR | Status: DC | PRN
Start: 2022-04-15 — End: 2022-04-16

## 2022-04-15 MED ORDER — ALBUTEROL SULFATE 2.5 MG/3 ML (0.083 %) SOLUTION FOR NEBULIZATION
2.5000 mg | INHALATION_SOLUTION | Freq: Once | RESPIRATORY_TRACT | Status: DC | PRN
Start: 2022-04-15 — End: 2022-04-16

## 2022-04-15 MED ORDER — PALONOSETRON 0.25 MG/5 ML INTRAVENOUS SOLUTION
0.2500 mg | Freq: Once | INTRAVENOUS | Status: AC
Start: 2022-04-15 — End: 2022-04-15
  Administered 2022-04-15: 0.25 mg via INTRAVENOUS

## 2022-04-15 MED ORDER — LORAZEPAM 0.5 MG TABLET
0.5000 mg | ORAL_TABLET | Freq: Four times a day (QID) | ORAL | Status: DC | PRN
Start: 2022-04-15 — End: 2022-04-16

## 2022-04-15 MED ORDER — SODIUM CHLORIDE 0.9 % INTRAVENOUS SOLUTION
150.0000 mg | Freq: Once | INTRAVENOUS | Status: AC
Start: 2022-04-15 — End: 2022-04-15
  Administered 2022-04-15: 0 mg via INTRAVENOUS
  Administered 2022-04-15: 150 mg via INTRAVENOUS
  Filled 2022-04-15: qty 5

## 2022-04-15 MED ORDER — SODIUM CHLORIDE 0.9 % INTRAVENOUS SOLUTION
436.8000 mg | Freq: Once | INTRAVENOUS | Status: AC
Start: 2022-04-15 — End: 2022-04-15
  Administered 2022-04-15: 435 mg via INTRAVENOUS
  Administered 2022-04-15: 0 mg via INTRAVENOUS
  Filled 2022-04-15: qty 43.5

## 2022-04-15 MED ORDER — SODIUM CHLORIDE 0.9% FLUSH BAG - 250 ML
INTRAVENOUS | Status: DC | PRN
Start: 2022-04-15 — End: 2022-04-16

## 2022-04-15 MED ORDER — FAMOTIDINE (PF) 20 MG/2 ML INTRAVENOUS SOLUTION
20.0000 mg | Freq: Once | INTRAVENOUS | Status: DC | PRN
Start: 2022-04-15 — End: 2022-04-16

## 2022-04-15 MED ORDER — SODIUM CHLORIDE 0.9 % INTRAVENOUS SOLUTION
12.0000 mg | Freq: Once | INTRAVENOUS | Status: AC
Start: 2022-04-15 — End: 2022-04-15
  Administered 2022-04-15: 0 mg via INTRAVENOUS
  Administered 2022-04-15: 12 mg via INTRAVENOUS
  Filled 2022-04-15: qty 3

## 2022-04-15 MED ORDER — LORAZEPAM 2 MG/ML INJECTION WRAPPER
0.5000 mg | Freq: Four times a day (QID) | INTRAMUSCULAR | Status: DC | PRN
Start: 2022-04-15 — End: 2022-04-16

## 2022-04-15 MED ORDER — CYANOCOBALAMIN (VIT B-12) 1,000 MCG/ML INJECTION SOLUTION
1000.0000 ug | Freq: Once | INTRAMUSCULAR | Status: AC
Start: 2022-04-15 — End: 2022-04-15
  Administered 2022-04-15: 1000 ug via SUBCUTANEOUS

## 2022-04-15 MED ORDER — PALONOSETRON 0.25 MG/5 ML INTRAVENOUS SOLUTION
INTRAVENOUS | Status: AC
Start: 2022-04-15 — End: 2022-04-15
  Filled 2022-04-15: qty 5

## 2022-04-15 MED ORDER — DIPHENHYDRAMINE 50 MG/ML INJECTION SOLUTION
50.0000 mg | Freq: Once | INTRAMUSCULAR | Status: DC | PRN
Start: 2022-04-15 — End: 2022-04-16

## 2022-04-15 MED ORDER — DEXTROSE 5% IN WATER (D5W) FLUSH BAG - 250 ML
INTRAVENOUS | Status: DC | PRN
Start: 2022-04-15 — End: 2022-04-16

## 2022-04-15 NOTE — Nurses Notes (Signed)
0930 - Patient ambulatory to room for treatment following clinic appointment. Assessment complete. Lungs clear. Abdomen soft and non-tender, bowel sounds present. No edema noted. [facility], RN  Patient Assessment/Symptom Management Patient Has MD Appointment Today   Key: (+) Symptom present           (-)  Symptom not present If Symptom is Positive(+) a Nursing Note is required   Edema -   Uncontrolled Nausea -   Vomiting -   Inability to eat/drink -   Mouth Sores -   Diarrhea -   Constipation (? Last BM) -   Fatigue that interferes with ADL's -   Numbness/Tingling -change -   Other -   Fever/Signs & Symptoms of infection -   Nurse Initials JD   0940 - PAC accessed at this time. Patient tolerated well. [facility], RN  [phone] - Labs reviewed and are good for treatment. Orders released to pharmacy. [facility], RN  [phone] - Premedications Aloxi, Zyprexa, and Vitamin B12 injection given. Verbal education provided. [facility], RN  [phone] - Emend infusion started. Verbal education provided.  [facility], RN  1058 - Emend infusion complete. [facility], RN  [phone] - Decadron infusion started. Verbal education provided. [facility], RN  [phone] - Decadron infusion complete. [facility], RN  [phone] - Keytruda infusion started. Verbal education and handouts provided for Keytruda, Alimta, and Carboplatin. Patient and family informed of adverse reactions to watch for and know to notify nursing if this occurs. [facility], RN  [phone] - Keytruda infusion complete. Line flushing. [facility], RN  [phone] - Alimta infusion started. [facility], RN  [phone] - Alimta infusion complete. Line flushing. [facility], RN  [phone] - Carboplatin infusion started. [facility], RN  [phone] - Carboplatin infusion complete. Line flushing. Patient tolerated well. [facility], RN  1340 - PAC flushed with 21ml NS and deaccessed at this time. Gauze dressing and adhesive bandage applied. [facility], RN

## 2022-04-15 NOTE — Progress Notes (Signed)
Department of Hematology/Oncology  History and Physical    Name: Rose Solomon  ALP:[id number]  Date of Birth: 07-19-1950  Encounter Date: 04/15/2022    REFERRING PROVIDER:  No referring provider defined for this encounter.    TELEMEDICINE DOCUMENTATION:  Patient Location:  [facility], [facility] outpatient Hematology/Oncology [address], [place] [address]  Patient/family aware of provider location:  yes  Patient/family consent for telemedicine:  yes    REASON FOR OFFICE VISIT:  New patient for evaluation and management of stage III+ non-small-cell lung cancer    HISTORY OF PRESENT ILLNESS:  Rose Solomon is a 71 y.o. female who presents today for evaluation of non-small-cell lung cancer.  She states that she has had some pain behind the right scapula for more than a year.  Recent imaging showed her to have an abnormality in that area that was worrisome for malignancy.  She would a biopsy performed and it was confirmed to be non-small-cell lung cancer.  It was an adenocarcinoma and not squamous.  A PET-CT was performed to formalize her staging.  She was found to have mediastinal and supraclavicular lymphadenopathy, which would make this stage III.  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  The adrenal gland appeared to be the only site of distant involvement.    The patient has a high degree of anxiety regarding treatment.  She had a family member who did poorly with cancer treatment in the past.    02/20/2022: The patient is here for follow up of non-small-cell lung cancer.  She is currently set up to have stereotactic radiation to the adrenal metastasis, followed by combined chemoradiation for the remainder of her disease.    03/25/2022: The patient is here for follow up of non-small-cell lung cancer.  She had an additional delay in care due to a desire to not pursue treatment for awhile.  She is having more neurologic issues lately,  and is more agreeable to considering treatment.  She is been having some difficulty using her left arm and has been getting intermittently more confused.    04/15/2022: The patient is here for follow up of non-small-cell lung cancer.  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    ROS:   Review of Systems   Constitutional:  Negative for appetite change, chills and fatigue.   HENT:   Negative for sore throat and trouble swallowing.    Eyes:  Negative for eye problems.   Respiratory:  Negative for cough and shortness of breath.    Cardiovascular:  Negative for chest pain and leg swelling.   Gastrointestinal:  Negative for abdominal pain.   Genitourinary:  Negative for dysuria and hematuria.    Musculoskeletal:  Negative for arthralgias and gait problem.   Skin:  Negative for rash.   Neurological:  Negative for gait problem.   Hematological:  Negative for adenopathy.   Psychiatric/Behavioral:  Negative for depression.         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)          Past Surgical History:   Procedure Laterality Date    BRONCHOSCOPY      HX APPENDECTOMY  HX CESAREAN SECTION      HX CHOLECYSTECTOMY      HX COLONOSCOPY      HX TUBAL LIGATION      NECK SURGERY           Social History     Socioeconomic History    Marital status: Married     Spouse name: Not on file    Number of children: Not on file    Years of education: Not on file    Highest education level: Not on file   Occupational History    Not on file   Tobacco Use    Smoking status: Every Day     Packs/day: .25     Types: Cigarettes    Smokeless tobacco: Never   Vaping Use    Vaping Use: Former   Substance and Sexual Activity    Alcohol use: Not Currently    Drug use: Not Currently    Sexual activity: Not on file   Other Topics Concern    Not on file    Social History Narrative    Not on file     Social Determinants of Health     Financial Resource Strain: Not on file   Transportation Needs: Not on file   Social Connections: Not on file   Intimate Partner Violence: Not on file   Housing Stability: Not on file     Family Medical History:       Problem Relation (Age of Onset)    Alzheimer's/Dementia Mother    Congestive Heart Failure Father    Leukemia Mother    Lung Cancer Sister            Current Outpatient Medications   Medication Sig    acetaminophen (TYLENOL) 500 mg Oral Tablet Take 1 Tablet (500 mg total) by mouth Every 4 hours as needed    dexAMETHasone (DECADRON) 4 mg Oral Tablet Take 1 Tablet (4 mg total) by mouth Twice daily The day before, day of, and day after chemotherapy.    dexAMETHasone (DECADRON) 4 mg Oral Tablet Take 1 Tablet (4 mg total) by mouth Four times a day    folic acid (FOLVITE) 1 mg Oral Tablet Take 1 Tablet (1 mg total) by mouth Once a day    ondansetron (ZOFRAN ODT) 4 mg Oral Tablet, Rapid Dissolve Take 1 Tablet (4 mg total) by mouth Every 8 hours as needed for Nausea/Vomiting    ondansetron (ZOFRAN) 8 mg Oral Tablet     Oxycodone (ROXICODONE) 10 mg Oral Tablet     traMADoL (ULTRAM) 50 mg Oral Tablet Take 1 Tablet (50 mg total) by mouth Every 4 hours as needed for Pain     No Known Allergies    PHYSICAL EXAM:  Most Recent Vitals    Flowsheet Row Telemedicine from 02/03/2022 in Hematology/Oncology,   [facility]   Temperature 36.4 C (97.6 F) filed at... 02/03/2022 1425   Heart Rate 91 filed at... 02/03/2022 1425   Respiratory Rate --   BP (Non-Invasive) 122/71 filed at... 02/03/2022 1425   SpO2 95 % filed at... 02/03/2022 1425   Height 1.6 m (5\' 3" ) filed at... 02/03/2022 1425   Weight 41.3 kg (91 lb 1.6 oz) filed at... 02/03/2022 1425   BMI (Calculated) 16.17 filed at... 02/03/2022 1425   BSA (Calculated) 1.36 filed at... 02/03/2022 1425      ECOG Status: (1) Restricted in physically strenuous activity, ambulatory  and able to do work of light nature  Physical Exam  Constitutional:       General: She is not in acute distress.     Appearance: Normal appearance.   Eyes:      Extraocular Movements: Extraocular movements intact.   Cardiovascular:      Rate and Rhythm: Normal rate and regular rhythm.   Pulmonary:      Effort: Pulmonary effort is normal.   Abdominal:      General: Abdomen is flat.      Palpations: Abdomen is soft.   Musculoskeletal:         General: Normal range of motion.      Cervical back: Normal range of motion.   Skin:     General: Skin is warm and dry.   Neurological:      General: No focal deficit present.      Mental Status: She is alert.   Psychiatric:         Mood and Affect: Mood normal.         DIAGNOSTIC DATA:  No results found for this or any previous visit (from the past 17520 hour(s)).    LABS:   CBC  Diff   Lab Results   Component Value Date/Time    WBC 14.5 (H) 03/25/2022 08:43 AM    HGB 14.1 03/25/2022 08:43 AM    HCT 41.7 03/25/2022 08:43 AM    PLTCNT 256 03/25/2022 08:43 AM    RBC 4.25 03/25/2022 08:43 AM    MCV 98.3 (H) 03/25/2022 08:43 AM    MCHC 33.8 03/25/2022 08:43 AM    MCH 33.2 (H) 03/25/2022 08:43 AM    RDW 12.8 03/25/2022 08:43 AM    MPV 9.0 03/25/2022 08:43 AM    Lab Results   Component Value Date/Time    PMNS 92 (H) 03/25/2022 08:43 AM    LYMPHOCYTES 4 (L) 03/25/2022 08:43 AM    EOSINOPHIL 0 (L) 03/25/2022 08:43 AM    MONOCYTES 5 03/25/2022 08:43 AM    BASOPHILS 0 03/25/2022 08:43 AM    BASOPHILS 0.00 03/25/2022 08:43 AM    PMNABS 13.30 (H) 03/25/2022 08:43 AM    LYMPHSABS 0.50 (L) 03/25/2022 08:43 AM    EOSABS 0.00 03/25/2022 08:43 AM    MONOSABS 0.70 03/25/2022 08:43 AM            ASSESSMENT:    ICD-10-CM    1. Malignant neoplasm of lung, unspecified laterality, unspecified part of lung (CMS HCC)  C34.90            PLAN:   1. All relevant medical records were reviewed including available pertinent provider notes, procedure notes, imaging, laboratory, and pathology.   2. All  pertinent labs and/or imaging were reviewed with the patient.   3. Non-small-cell lung cancer, stage III+.  She is completed radiation treatment to the brain metastasis and is ready to begin chemotherapy.  She did not have an actionable driver mutation based on the [facility] panel.  4. Pain:  She reports that she does have some pain in the area of the original site of involvement.  Hydrocodone was prescribed previously.    Rose Solomon was given the chance to ask questions, and these were answered to their satisfaction. The patient is welcome to call with any questions or concerns in the meantime.     On the day of the encounter, a total of 35 minutes was spent on this patient encounter including review of historical information, examination, documentation and post-visit activities.   Return in  about 3 weeks (around 05/06/2022).     Narda Rutherford, MD  04/15/2022, 09:29  The patient was seen as part of a collaborative telemedicine service with Dr. Jake Shark who participated in the encounter by active presence via approved video/audio means for portions of the encounter.  The patient's insurance company bears full legal and financial responsibility resulting from any deviations that they cause to my recommended treatment plan.     CC:  Adair Laundry, DO  [place] [id number]    No referring provider defined for this encounter.    This note was partially generated using MModal Fluency Direct system, and there may be some incorrect words, spellings, and punctuation that were not noted in checking the note before saving.

## 2022-04-22 ENCOUNTER — Inpatient Hospital Stay (HOSPITAL_COMMUNITY): Payer: Self-pay

## 2022-04-24 ENCOUNTER — Telehealth (HOSPITAL_COMMUNITY): Payer: Self-pay | Admitting: HEMATOLOGY-ONCOLOGY

## 2022-04-24 ENCOUNTER — Other Ambulatory Visit (HOSPITAL_COMMUNITY): Payer: Self-pay | Admitting: HEMATOLOGY-ONCOLOGY

## 2022-04-24 MED ORDER — MAGIC MOUTHWASH
10.0000 mL | ORAL | 0 refills | Status: AC | PRN
Start: 2022-04-24 — End: ?

## 2022-04-24 NOTE — Telephone Encounter (Addendum)
I called patient's husband and let him know we could send in Magic Mouth wash for the sore spots in her mouth, I have pended this medication to [facility] and sent message to physician. I discussed with husband that patient needs to avoid spicy foods, she could try smaller meals, and keep mouth clean. I told husband I will message Dr Jake Shark regarding the rash he describes as on both patient's ankles and feet. The rash started yesterday and is not raised and does not itch. Patient has not used any new detergents, soap, etc. Last treatment 11/21.      ----- Message from Carles Collet sent at 04/24/2022 12:34 PM EST -----  Regarding: concerns  Her husband called and said her voice is now deeper n hoarser, and has spots in her mouth, also has red spots, some are lineshe said its hard to explain, on her feet and lower leg in the ankle area last tx was 11/21 and he is asking for someone to call back to give advice about these concerns

## 2022-04-26 ENCOUNTER — Other Ambulatory Visit: Payer: Self-pay

## 2022-04-26 ENCOUNTER — Emergency Department (HOSPITAL_COMMUNITY): Payer: Commercial Managed Care - PPO

## 2022-04-26 ENCOUNTER — Emergency Department
Admission: EM | Admit: 2022-04-26 | Discharge: 2022-04-26 | Disposition: A | Discharge status: Home / Self Care | Payer: Commercial Managed Care - PPO | Attending: Emergency Medicine | Admitting: Emergency Medicine

## 2022-04-26 ENCOUNTER — Encounter (HOSPITAL_COMMUNITY): Payer: Self-pay

## 2022-04-26 DIAGNOSIS — D696 Thrombocytopenia, unspecified: Secondary | ICD-10-CM | POA: Insufficient documentation

## 2022-04-26 DIAGNOSIS — R000 Tachycardia, unspecified: Secondary | ICD-10-CM

## 2022-04-26 DIAGNOSIS — W19XXXA Unspecified fall, initial encounter: Secondary | ICD-10-CM

## 2022-04-26 DIAGNOSIS — J9811 Atelectasis: Secondary | ICD-10-CM | POA: Insufficient documentation

## 2022-04-26 DIAGNOSIS — W109XXA Fall (on) (from) unspecified stairs and steps, initial encounter: Secondary | ICD-10-CM | POA: Insufficient documentation

## 2022-04-26 DIAGNOSIS — R531 Weakness: Secondary | ICD-10-CM | POA: Insufficient documentation

## 2022-04-26 DIAGNOSIS — S7002XA Contusion of left hip, initial encounter: Secondary | ICD-10-CM | POA: Insufficient documentation

## 2022-04-26 DIAGNOSIS — F17210 Nicotine dependence, cigarettes, uncomplicated: Secondary | ICD-10-CM | POA: Insufficient documentation

## 2022-04-26 DIAGNOSIS — C7931 Secondary malignant neoplasm of brain: Secondary | ICD-10-CM | POA: Insufficient documentation

## 2022-04-26 DIAGNOSIS — C7970 Secondary malignant neoplasm of unspecified adrenal gland: Secondary | ICD-10-CM | POA: Insufficient documentation

## 2022-04-26 DIAGNOSIS — C7800 Secondary malignant neoplasm of unspecified lung: Secondary | ICD-10-CM | POA: Insufficient documentation

## 2022-04-26 DIAGNOSIS — C3490 Malignant neoplasm of unspecified part of unspecified bronchus or lung: Secondary | ICD-10-CM

## 2022-04-26 DIAGNOSIS — S40012A Contusion of left shoulder, initial encounter: Secondary | ICD-10-CM | POA: Insufficient documentation

## 2022-04-26 LAB — CBC WITH DIFF
BASOPHIL #: 0 10*3/uL (ref 0.00–0.10)
BASOPHIL %: 0 % (ref 0–1)
EOSINOPHIL #: 0 10*3/uL (ref 0.00–0.50)
EOSINOPHIL %: 0 % — ABNORMAL LOW
HCT: 39.9 % (ref 31.2–41.9)
HGB: 13.7 g/dL (ref 10.9–14.3)
LYMPHOCYTE #: 0.3 10*3/uL — ABNORMAL LOW (ref 1.00–3.00)
LYMPHOCYTE %: 8 % — ABNORMAL LOW (ref 16–44)
MCH: 33.3 pg — ABNORMAL HIGH (ref 24.7–32.8)
MCHC: 34.3 g/dL (ref 32.3–35.6)
MCV: 97 fL — ABNORMAL HIGH (ref 75.5–95.3)
MONOCYTE #: 0.1 10*3/uL — ABNORMAL LOW (ref 0.30–1.00)
MONOCYTE %: 3 % — ABNORMAL LOW (ref 5–13)
MPV: 8.4 fL (ref 7.9–10.8)
NEUTROPHIL #: 3.6 10*3/uL (ref 1.85–7.80)
NEUTROPHIL %: 89 % — ABNORMAL HIGH (ref 43–77)
PLATELETS: 53 10*3/uL — ABNORMAL LOW (ref 140–440)
RBC COMMENT: NORMAL
RBC: 4.12 10*6/uL (ref 3.63–4.92)
RDW: 13.1 % (ref 12.3–17.7)
WBC: 4.1 10*3/uL (ref 3.8–11.8)

## 2022-04-26 LAB — URINALYSIS, MACROSCOPIC
BILIRUBIN: NEGATIVE mg/dL
BLOOD: NEGATIVE mg/dL
GLUCOSE: NEGATIVE mg/dL
KETONES: NEGATIVE mg/dL
LEUKOCYTES: NEGATIVE WBCs/uL
NITRITE: NEGATIVE
PH: 6.5 (ref 5.0–9.0)
PROTEIN: 20 mg/dL
SPECIFIC GRAVITY: 1.02 (ref 1.002–1.030)
UROBILINOGEN: 2 mg/dL — AB

## 2022-04-26 LAB — COMPREHENSIVE METABOLIC PANEL, NON-FASTING
ALBUMIN/GLOBULIN RATIO: 1.5 — ABNORMAL HIGH (ref 0.8–1.4)
ALBUMIN: 3.7 g/dL (ref 3.5–5.7)
ALKALINE PHOSPHATASE: 62 U/L (ref 34–104)
ALT (SGPT): 57 U/L — ABNORMAL HIGH (ref 7–52)
ANION GAP: 10 mmol/L (ref 4–13)
AST (SGOT): 40 U/L — ABNORMAL HIGH (ref 13–39)
BILIRUBIN TOTAL: 0.5 mg/dL (ref 0.3–1.2)
BUN/CREA RATIO: 51 — ABNORMAL HIGH (ref 6–22)
BUN: 23 mg/dL (ref 7–25)
CALCIUM, CORRECTED: 8.9 mg/dL (ref 8.9–10.8)
CALCIUM: 8.7 mg/dL (ref 8.6–10.3)
CHLORIDE: 97 mmol/L — ABNORMAL LOW (ref 98–107)
CO2 TOTAL: 25 mmol/L (ref 21–31)
CREATININE: 0.45 mg/dL — ABNORMAL LOW (ref 0.60–1.30)
ESTIMATED GFR: 103 mL/min/{1.73_m2} (ref >59)
GLOBULIN: 2.5 — ABNORMAL LOW (ref 2.9–5.4)
GLUCOSE: 140 mg/dL — ABNORMAL HIGH (ref 74–109)
OSMOLALITY, CALCULATED: 271 mOsm/kg (ref 270–290)
POTASSIUM: 3.7 mmol/L (ref 3.5–5.1)
PROTEIN TOTAL: 6.2 g/dL — ABNORMAL LOW (ref 6.4–8.9)
SODIUM: 132 mmol/L — ABNORMAL LOW (ref 136–145)

## 2022-04-26 LAB — URINALYSIS, MICROSCOPIC
BACTERIA: NEGATIVE /hpf
RBCS: 4 /hpf — ABNORMAL HIGH (ref <4)
SQUAMOUS EPITHELIAL: 2 /hpf (ref <28)
WBCS: 2 /hpf (ref <6)

## 2022-04-26 LAB — ECG 12 LEAD
Atrial Rate: 101 {beats}/min
Calculated P Axis: 78 degrees
Calculated R Axis: 129 degrees
Calculated T Axis: 85 degrees
PR Interval: 116 ms
QRS Duration: 72 ms
QT Interval: 330 ms
QTC Calculation: 427 ms
Ventricular rate: 101 {beats}/min

## 2022-04-26 LAB — BLUE TOP TUBE

## 2022-04-26 LAB — GOLD TOP TUBE

## 2022-04-26 LAB — POC BLOOD GLUCOSE (RESULTS): GLUCOSE, POC: 153 mg/dl (ref 50–500)

## 2022-04-26 NOTE — ED Attending Handoff Note (Signed)
[facility]  Emergency Department  Provider in Triage Note    Name: Rose Solomon  Age: 71 y.o.  Gender: female     Subjective:   Rose Solomon is a 71 y.o. female who presents with complaint of Fall  . The husband reports left side weakness. This has been progressive for months. There was a fall today.    Objective:   Filed Vitals:    04/26/22 1132 04/26/22 1133   BP:  111/88   Pulse:  (!) 107   Resp:  20   Temp: 37.1 C (98.7 F)    SpO2:  95%      Vitals are also documented in the EMR.  Focused Physical Exam shows 71 y.o. female. No overt tenderness.     Assessment:  A medical screening exam was completed.  This patient is a 71 y.o. female with Fall  .    Plan:  Please see initial orders and work-up in the EMR.  This is to be continued with full evaluation in the main Emergency Department.     No current facility-administered medications for this encounter.     Results for orders placed or performed during the hospital encounter of 04/26/22 (from the past 24 hour(s))   URINALYSIS, MACROSCOPIC AND MICROSCOPIC W/CULTURE REFLEX    Specimen: Urine, Clean Catch    Narrative    The following orders were created for panel order URINALYSIS, MACROSCOPIC AND MICROSCOPIC W/CULTURE REFLEX.  Procedure                               Abnormality         Status                     ---------                               -----------         ------                     URINALYSIS, MACROSCOPIC[[id number]]                                                     URINALYSIS, MICROSCOPIC[[id number]]                                                       Please view results for these tests on the individual orders.   CBC/DIFF    Narrative    The following orders were created for panel order CBC/DIFF.  Procedure                               Abnormality         Status                     ---------                               -----------         ------  CBC WITH SWFU[[id number]]                                                                  Please view results for these tests on the individual orders.          /R. Baldo Daub, MD, Wilber Oliphant   04/26/2022, 11:34   Department of Emergency Medicine  [facility]

## 2022-04-26 NOTE — ED Triage Notes (Addendum)
Patient arrived to ER S.P fall down one step. Reports she landed on her buttock. Denies blood thinners, hitting head, LOC, and neck/back pain. Reports increased weakness to left arm and leg since fall.

## 2022-04-26 NOTE — Discharge Instructions (Signed)
Take all regular medications as prescribed  Do not ambulate/walk around house without assistance  Follow-up with Dr. Jake Shark on Monday; call his office 1st thing in the morning on Monday morning  Return to emergency room for any increased weakness, shortness of breath, chest pain, or any

## 2022-04-26 NOTE — ED Nurses Note (Signed)
Patient discharged home with family.  AVS reviewed with patient.  A written copy of the AVS and discharge instructions was given to the patient.  Questions sufficiently answered as needed.  Patient encouraged to follow up with PCP as indicated.  In the event of an emergency, patient instructed to call 911 or go to the nearest emergency room.

## 2022-04-26 NOTE — ED Provider Notes (Signed)
[facility]  ED Primary Provider Note        Arrival: The patient arrived by Car     History of Present Illness   chief complaint  Rose Solomon is a 71 y.o. female who had concerns including Fall.  Patient is 71 year old female presents emergency room with a chief complaint of fall down 1 step today.  She was complaining of pain in the left hip and left shoulder.  Patient was an oncology patient.  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  She was currently receiving treatment through Dr. Jake Shark.  She was scheduled to have another round of chemo starting December 12th.  She was had chemo and radiation treatment.  She reports that she is been having progressive weakness over the past several days.  Patient does continue to smoke 1/2 pack of cigarettes per day.  She denies any chest pain or fever.  She is had chronic shortness of breath.  Patient is quite thin and cachectic.  She has full range of motion of bilateral upper and lower extremities.  Strength appears to be equal bilaterally.  No appreciable deficits but significant weakness bilaterally.  Patient does have good sensation over all dermatomes.  All nursing notes reviewed        Review of Systems     No other overt Review of Systems are noted to be positive except noted in the HPI.      Historical Data   History Reviewed This Encounter: Medical History  Surgical History  Family History  Social History      Physical Exam   ED Triage Vitals   BP (Non-Invasive) 04/26/22 1133 111/88   Heart Rate 04/26/22 1133 (!) 107   Respiratory Rate 04/26/22 1133 20   Temperature 04/26/22 1132 37.1 C (98.7 F)   SpO2 04/26/22 1133 95 %   Weight 04/26/22 1133 (!) 39.9 kg (88 lb)   Height 04/26/22 1133 1.6 m (_0 )         Exam:   Constitutional:  Patient alert orient x3 in no apparent distress.  Very pleasant and cooperative No limitations.  Head: Atraumatic normocephalic  Eyes :  Pupils are equal round reactive to  light and accommodation extraocular muscles are intact.  Sclera and conjunctiva are unremarkable  Ears:  Tympanic membranes are pearly gray bilaterally; external auditory canals are unremarkable; external ears without any lesions  Nose:  Nares are patent turbinates are pink and moist  Mouth:  Mucosa is pink and moist without lesions.  Posterior pharynx is pink and moist without hypertrophy/exudate.  Neck:  Soft and supple without palpable lymphadenopathy.    Heart: Tachycardic with a rate approximately 108.  1/6 holosystolic ejection murmur    Lungs:  Coarse breath sounds bilaterally with some rhonchi that clear on the left with coughing.  No wheezing or rales.    Abdomen:  Soft nontender without any rebound or guarding; positive bowel sounds throughout  Genitalia:  Deferred  Skin:  Patient does have petechiae and bilateral feet.  Skin is dry.  Normal capillary refill.    Extremities:  Generalized bilateral upper extremity weakness.  She has full range of motion but weakness as noted above.  She was tender over the lateral aspect of the left shoulder and left hip.  No appreciable deformity.  No bruising.  No abrasions  Neuro:  Alert oriented x3.  Cranial nerves II-XII grossly intact as tested.  Excellent sensation distally over  all dermatomes.  Psychiatric:  Patient cooperative, affect appropriate, insight and judgment good          Procedures      Patient Data     Labs Ordered/Reviewed   COMPREHENSIVE METABOLIC PANEL, NON-FASTING - Abnormal; Notable for the following components:       Result Value    SODIUM 132 (*)     CHLORIDE 97 (*)     CREATININE 0.45 (*)     BUN/CREA RATIO 51 (*)     GLUCOSE 140 (*)     ALT (SGPT) 57 (*)     AST (SGOT) 40 (*)     PROTEIN TOTAL 6.2 (*)     ALBUMIN/GLOBULIN RATIO 1.5 (*)     GLOBULIN 2.5 (*)     All other components within normal limits    Narrative:     Estimated Glomerular Filtration Rate (eGFR) is calculated using the CKD-EPI (2021) equation, intended for patients 79 years of  age and older. If gender is not documented or "unknown", there will be no eGFR calculation.   URINALYSIS, MACROSCOPIC - Abnormal; Notable for the following components:    UROBILINOGEN 2 (*)     All other components within normal limits   URINALYSIS, MICROSCOPIC - Abnormal; Notable for the following components:    MUCOUS Rare (*)     BUDDING YEAST Rare (*)     RBCS 4 (*)     All other components within normal limits   CBC WITH DIFF - Abnormal; Notable for the following components:    MCV 97.0 (*)     MCH 33.3 (*)     PLATELETS 53 (*)     NEUTROPHIL % 89 (*)     LYMPHOCYTE % 8 (*)     MONOCYTE % 3 (*)     EOSINOPHIL % 0 (*)     LYMPHOCYTE # 0.30 (*)     MONOCYTE # 0.10 (*)     All other components within normal limits   POC BLOOD GLUCOSE (RESULTS) - Normal   URINALYSIS, MACROSCOPIC AND MICROSCOPIC W/CULTURE REFLEX    Narrative:     The following orders were created for panel order URINALYSIS, MACROSCOPIC AND MICROSCOPIC W/CULTURE REFLEX.  Procedure                               Abnormality         Status                     ---------                               -----------         ------                     URINALYSIS, MACROSCOPIC[[id number]]      Abnormal            Final result               URINALYSIS, MICROSCOPIC[[id number]]      Abnormal            Final result                 Please view results for these tests on the individual orders.   CBC/DIFF    Narrative:     The following orders  were created for panel order CBC/DIFF.  Procedure                               Abnormality         Status                     ---------                               -----------         ------                     CBC WITH TGPQ[[id number]]                Abnormal            Final result                 Please view results for these tests on the individual orders.   EXTRA TUBES    Narrative:     The following orders were created for panel order EXTRA TUBES.  Procedure                               Abnormality         Status                      ---------                               -----------         ------                     BLUE TOP ENMM[[id number]]                                    Final result               GOLD TOP RPRX[[id number]]                                    Final result                 Please view results for these tests on the individual orders.   BLUE TOP TUBE   GOLD TOP TUBE   PERFORM POC WHOLE BLOOD GLUCOSE       CT BRAIN WO IV CONTRAST   Final Result by Edi, Radresults In (12/02 1319)   1. NO ACUTE INTRACRANIAL HEMORRHAGE   2. Vasogenic edema in the right parietal and occipital lobes related to an underlying mass lesion described on the MRI scan from 03/26/2022         One or more dose reduction techniques were used (e.g., Automated exposure control, adjustment of the mA and/or kV according to patient size, use of iterative reconstruction technique).         Radiologist location ID: [id number]         XR SHOULDER LEFT   Final Result by Edi, Radresults In (12/02 1228)   NO ACUTE FRACTURE OR DISLOCATION.  Radiologist location ID: [id number]         XR HIP LEFT   Final Result by Edi, Radresults In (12/02 1227)   NO ACUTE FRACTURE OR DISLOCATION.       If acute hip fracture is suspected after a fall or minor trauma and initial radiographs are negative then MRI of the pelvis and affected hip without IV contrast or CT of the pelvis and hips without IV contrast is usually appropriate as the next imaging study. (ACR Appropriateness Criteria: Acute Hip Pain-Suspected Fracture, 2018)                Radiologist location ID: [id number]         XR CHEST AP AND LATERAL   Final Result by Edi, Radresults In (12/02 1227)   Mild right basilar atelectasis. No acute osseous abnormalities.         Radiologist location ID: [place] [facility]          Medical Decision Making  Patient does have low platelets 53.  Imaging was unremarkable.  Patient does have some petechiae to bilateral feet.  I  discussed findings in detail with her and her husband.  Patient is declining to stay.  She does agree to go home.  Husband is going to move her bed so that it was down on the 1st floor and patient does not have to ambulate up steps.  Husband also states that he will be with the patient whenever she was up and ambulating.  Patient will follow up with her oncologist (Dr. Jake Shark) on Monday.  She does agree to return to emergency room for any weakness.  I tried repeatedly to encourage patient to stay for observation she declined.      Amount and/or Complexity of Data Reviewed  Labs: ordered. Decision-making details documented in ED Course.  Radiology: ordered and independent interpretation performed. Decision-making details documented in ED Course.  ECG/medicine tests: ordered and independent interpretation performed. Decision-making details documented in ED Course.    Critical Care  Total time providing critical care: 0 minutes        ED Course as of 04/26/22 1734   Sat Apr 26, 2022   1241 EKG shows sinus tachycardia with a rate of 101, right axis, late R-wave progression, no ectopy, enlarged P waves.  Inverted T-waves in anterior leads.  Nonspecific ST changes throughout.  Normal PR/QRS interval   1245 Point of care glucose 153   1245 Three-view left shoulder shows no fracture, no dislocation, no deformity.  Significant degenerative change   1246 Two-view left hip shows no fracture, no deformity, no dislocation.  Significant degenerative change   1246 Two-view chest shows atelectasis in the right base.  Port is noted in left chest.  No acute infiltrate, no appreciable lesions or masses   1246 Point of care glucose 153   1323 CT brain:IMPRESSION:  1.         NO ACUTE INTRACRANIAL HEMORRHAGE  2.         Vasogenic edema in the right parietal and occipital lobes related to an underlying mass lesion described on the MRI scan from 03/26/2022        1359 Sodium is 132 with a chloride of 97, BUN of 23 with a creatinine 0.45,  glucose 140; ALT of 57 with an AST of 40, total protein 6.2   1359 CBC shows   1400 Platelets of 53  with a absolute neutrophil count of 89   1618 Urinalysis shows 2 urobilinogen, 4 red blood cells per high-power field, only 2 white blood cells per high-power field              Following the history, physical exam, and ED workup, the patient was deemed stable and suitable for discharge. The patient/caregiver was advised to return to the ED for any new or worsening symptoms. Discharge medications, and follow-up instructions were discussed with the patient/caregiver in detail, who verbalizes understanding. The patient/caregiver is in agreement and is comfortable with the plan of care.    Disposition: Discharged         Current Discharge Medication List        CONTINUE these medications - NO CHANGES were made during your visit.        Details   acetaminophen 500 mg Tablet  Commonly known as: TYLENOL   500 mg, Oral, EVERY 4 HOURS PRN  Refills: 0     * dexAMETHasone 4 mg Tablet  Commonly known as: DECADRON   4 mg, Oral, 2 TIMES DAILY, The day before, day of, and day after chemotherapy.  Qty: 24 Tablet  Refills: 2     * dexAMETHasone 4 mg Tablet  Commonly known as: DECADRON   4 mg, Oral, 4 TIMES DAILY  Qty: 90 Tablet  Refills: 1     folic acid 1 mg Tablet  Commonly known as: FOLVITE   1 mg, Oral, DAILY  Qty: 30 Tablet  Refills: 2     lidocaine, diphenhydramine, maalox Solution  Commonly known as: MAGIC MOUTHWASH   10 mL, Swish & Spit, EVERY 2 HOURS PRN, Contains lidocaine viscous 2%, diphenhydramine 12.5 mg/5 ml, Maalox. Mix 1:1:1  Qty: 120 mL  Refills: 0     ondansetron 4 mg Tablet, Rapid Dissolve  Commonly known as: ZOFRAN ODT   4 mg, Oral, EVERY 8 HOURS PRN  Qty: 14 Tablet  Refills: 0     ondansetron 8 mg Tablet  Commonly known as: ZOFRAN   Take 1 Tablet (8 mg total) by mouth Every 8 hours as needed for Nausea/Vomiting  Refills: 0     Oxycodone 10 mg Tablet  Commonly known as: ROXICODONE   Take 1 Tablet (10 mg total) by  mouth Every 4 hours as needed for Pain  Refills: 0     traMADoL 50 mg Tablet  Commonly known as: ULTRAM   50 mg, Oral, EVERY 4 HOURS PRN  Qty: 20 Tablet  Refills: 1           * This list has 2 medication(s) that are the same as other medications prescribed for you. Read the directions carefully, and ask your doctor or other care provider to review them with you.                Follow up:   Narda Rutherford, MD  [address]  [place] [address]  [phone]    In 2 days                   Clinical Impression   Contusion of left shoulder (Primary)   Contusion of left hip, initial encounter   Generalized weakness   Primary malignant neoplasm of lung metastatic to other site, unspecified laterality (CMS HCC)   Fall   Thrombocytopenia (CMS [facility])         Current Discharge Medication List          R.A. Taegan Standage, DO  Department of Emergency Medicine

## 2022-04-26 NOTE — ED Nurses Note (Signed)
PT wheeled to bed 8 from triage. Pt C/O falling earlier today down a set of stairs. Per husband, he is unsure if patient lost consciousness but states her mentation is questionable. During assessment, patient continues to state, "I just want to go home and smoke." Also, pt feet and legs are edematous with some petechia on her feet. Pt husband states she isn't on any fluid tablets. Pt husband states increased weakness that has been ongoing for a while now. Pt placed into bed and on cardiac monitoring, blood pressure, and pulse oximetry.

## 2022-05-06 ENCOUNTER — Ambulatory Visit (HOSPITAL_COMMUNITY): Payer: Commercial Managed Care - PPO

## 2022-05-06 ENCOUNTER — Ambulatory Visit (HOSPITAL_COMMUNITY): Payer: Self-pay | Admitting: NURSE PRACTITIONER

## 2022-05-26 DEATH — deceased

## 2022-05-27 ENCOUNTER — Ambulatory Visit (HOSPITAL_COMMUNITY): Payer: Commercial Managed Care - PPO
